# Patient Record
Sex: Female | Born: 1945 | Race: Black or African American | Hispanic: No | Marital: Single | Smoking: Never smoker
Health system: Southern US, Community
[De-identification: ages and names within clinical notes are randomized; demographics above are authoritative.]

## PROBLEM LIST (undated history)

## (undated) DIAGNOSIS — Z21 Asymptomatic human immunodeficiency virus [HIV] infection status: Secondary | ICD-10-CM

## (undated) DIAGNOSIS — B20 Human immunodeficiency virus [HIV] disease: Secondary | ICD-10-CM

## (undated) DIAGNOSIS — I10 Essential (primary) hypertension: Secondary | ICD-10-CM

## (undated) HISTORY — PX: BREAST EXCISIONAL BIOPSY: SUR124

## (undated) HISTORY — PX: OTHER SURGICAL HISTORY: SHX169

---

## 2015-09-28 DIAGNOSIS — I2510 Atherosclerotic heart disease of native coronary artery without angina pectoris: Secondary | ICD-10-CM | POA: Insufficient documentation

## 2015-09-28 DIAGNOSIS — I10 Essential (primary) hypertension: Secondary | ICD-10-CM | POA: Insufficient documentation

## 2015-09-28 DIAGNOSIS — E782 Mixed hyperlipidemia: Secondary | ICD-10-CM | POA: Insufficient documentation

## 2016-02-17 ENCOUNTER — Ambulatory Visit
Admission: RE | Admit: 2016-02-17 | Discharge: 2016-02-17 | Disposition: A | Discharge status: Home / Self Care | Payer: Medicare Other | Source: Ambulatory Visit | Attending: Ophthalmology | Admitting: Ophthalmology

## 2016-02-17 ENCOUNTER — Other Ambulatory Visit
Admission: RE | Admit: 2016-02-17 | Discharge: 2016-02-17 | Disposition: A | Discharge status: Home / Self Care | Payer: Medicare Other | Source: Ambulatory Visit | Attending: Ophthalmology | Admitting: Ophthalmology

## 2016-02-17 ENCOUNTER — Other Ambulatory Visit: Payer: Self-pay | Admitting: Ophthalmology

## 2016-02-17 DIAGNOSIS — H2000 Unspecified acute and subacute iridocyclitis: Secondary | ICD-10-CM | POA: Diagnosis present

## 2016-02-17 DIAGNOSIS — H209 Unspecified iridocyclitis: Secondary | ICD-10-CM

## 2016-02-17 LAB — CBC
HCT: 37.8 % (ref 35.0–47.0)
Hemoglobin: 13.2 g/dL (ref 12.0–16.0)
MCH: 29.2 pg (ref 26.0–34.0)
MCHC: 34.9 g/dL (ref 32.0–36.0)
MCV: 83.5 fL (ref 80.0–100.0)
Platelets: 186 10*3/uL (ref 150–440)
RBC: 4.52 MIL/uL (ref 3.80–5.20)
RDW: 13.3 % (ref 11.5–14.5)
WBC: 5.3 10*3/uL (ref 3.6–11.0)

## 2016-02-17 LAB — SEDIMENTATION RATE: Sed Rate: 48 mm/hr — ABNORMAL HIGH (ref 0–30)

## 2016-02-18 LAB — ANA W/REFLEX IF POSITIVE: Anti Nuclear Antibody(ANA): NEGATIVE

## 2016-02-19 LAB — MISC LABCORP TEST (SEND OUT): Labcorp test code: 10116

## 2016-02-20 LAB — QUANTIFERON IN TUBE
QFT TB AG MINUS NIL VALUE: 0.09 IU/mL
QUANTIFERON MITOGEN VALUE: 9.15 IU/mL
QUANTIFERON TB AG VALUE: 0.17 IU/mL
QUANTIFERON TB GOLD: NEGATIVE
Quantiferon Nil Value: 0.08 IU/mL

## 2016-02-20 LAB — QUANTIFERON TB GOLD ASSAY (BLOOD)

## 2016-02-23 LAB — MISC LABCORP TEST (SEND OUT): Labcorp test code: 6924

## 2016-04-24 DIAGNOSIS — Z9884 Bariatric surgery status: Secondary | ICD-10-CM | POA: Insufficient documentation

## 2016-04-27 ENCOUNTER — Emergency Department: Payer: Medicare Other

## 2016-04-27 ENCOUNTER — Emergency Department
Admission: EM | Admit: 2016-04-27 | Discharge: 2016-04-27 | Payer: Medicare Other | Attending: Emergency Medicine | Admitting: Emergency Medicine

## 2016-04-27 ENCOUNTER — Encounter: Payer: Self-pay | Admitting: Emergency Medicine

## 2016-04-27 DIAGNOSIS — Z79899 Other long term (current) drug therapy: Secondary | ICD-10-CM | POA: Insufficient documentation

## 2016-04-27 DIAGNOSIS — L0291 Cutaneous abscess, unspecified: Secondary | ICD-10-CM

## 2016-04-27 DIAGNOSIS — L02211 Cutaneous abscess of abdominal wall: Secondary | ICD-10-CM | POA: Insufficient documentation

## 2016-04-27 DIAGNOSIS — I10 Essential (primary) hypertension: Secondary | ICD-10-CM | POA: Diagnosis not present

## 2016-04-27 DIAGNOSIS — Z21 Asymptomatic human immunodeficiency virus [HIV] infection status: Secondary | ICD-10-CM | POA: Diagnosis not present

## 2016-04-27 DIAGNOSIS — T8140XA Infection following a procedure, unspecified, initial encounter: Secondary | ICD-10-CM | POA: Insufficient documentation

## 2016-04-27 HISTORY — DX: Asymptomatic human immunodeficiency virus (hiv) infection status: Z21

## 2016-04-27 HISTORY — DX: Human immunodeficiency virus (HIV) disease: B20

## 2016-04-27 HISTORY — DX: Essential (primary) hypertension: I10

## 2016-04-27 LAB — URINALYSIS, COMPLETE (UACMP) WITH MICROSCOPIC
Bacteria, UA: NONE SEEN
Bilirubin Urine: NEGATIVE
Glucose, UA: 50 mg/dL — AB
Hgb urine dipstick: NEGATIVE
Ketones, ur: 5 mg/dL — AB
Leukocytes, UA: NEGATIVE
Nitrite: NEGATIVE
Protein, ur: 100 mg/dL — AB
Specific Gravity, Urine: 1.026 (ref 1.005–1.030)
pH: 5 (ref 5.0–8.0)

## 2016-04-27 LAB — INFLUENZA PANEL BY PCR (TYPE A & B)
Influenza A By PCR: NEGATIVE
Influenza B By PCR: NEGATIVE

## 2016-04-27 LAB — COMPREHENSIVE METABOLIC PANEL
ALT: 18 U/L (ref 14–54)
AST: 31 U/L (ref 15–41)
Albumin: 3.5 g/dL (ref 3.5–5.0)
Alkaline Phosphatase: 80 U/L (ref 38–126)
Anion gap: 11 (ref 5–15)
BUN: 9 mg/dL (ref 6–20)
CO2: 25 mmol/L (ref 22–32)
Calcium: 9 mg/dL (ref 8.9–10.3)
Chloride: 101 mmol/L (ref 101–111)
Creatinine, Ser: 0.94 mg/dL (ref 0.44–1.00)
GFR calc Af Amer: >60 mL/min (ref >60)
GFR calc non Af Amer: >60 mL/min (ref >60)
Glucose, Bld: 117 mg/dL — ABNORMAL HIGH (ref 65–99)
Potassium: 3.5 mmol/L (ref 3.5–5.1)
Sodium: 137 mmol/L (ref 135–145)
Total Bilirubin: 1.8 mg/dL — ABNORMAL HIGH (ref 0.3–1.2)
Total Protein: 8.5 g/dL — ABNORMAL HIGH (ref 6.5–8.1)

## 2016-04-27 LAB — CBC WITH DIFFERENTIAL/PLATELET
Basophils Absolute: 0 10*3/uL (ref 0–0.1)
Basophils Relative: 0 %
Eosinophils Absolute: 0 10*3/uL (ref 0–0.7)
Eosinophils Relative: 0 %
HCT: 39.8 % (ref 35.0–47.0)
Hemoglobin: 13.7 g/dL (ref 12.0–16.0)
Lymphocytes Relative: 8 %
Lymphs Abs: 1 10*3/uL (ref 1.0–3.6)
MCH: 28.7 pg (ref 26.0–34.0)
MCHC: 34.5 g/dL (ref 32.0–36.0)
MCV: 83.3 fL (ref 80.0–100.0)
Monocytes Absolute: 0.6 10*3/uL (ref 0.2–0.9)
Monocytes Relative: 5 %
Neutro Abs: 10.9 10*3/uL — ABNORMAL HIGH (ref 1.4–6.5)
Neutrophils Relative %: 87 %
Platelets: 284 10*3/uL (ref 150–440)
RBC: 4.78 MIL/uL (ref 3.80–5.20)
RDW: 13.6 % (ref 11.5–14.5)
WBC: 12.5 10*3/uL — ABNORMAL HIGH (ref 3.6–11.0)

## 2016-04-27 LAB — LACTIC ACID, PLASMA: Lactic Acid, Venous: 1.9 mmol/L (ref 0.5–1.9)

## 2016-04-27 LAB — PROTIME-INR
INR: 1.2
Prothrombin Time: 15.3 seconds — ABNORMAL HIGH (ref 11.4–15.2)

## 2016-04-27 MED ORDER — ACETAMINOPHEN 325 MG PO TABS
650.0000 mg | ORAL_TABLET | Freq: Once | ORAL | Status: AC
  Administered 2016-04-27: 650 mg via ORAL
  Filled 2016-04-27: qty 2

## 2016-04-27 MED ORDER — VANCOMYCIN HCL IN DEXTROSE 1-5 GM/200ML-% IV SOLN
1000.0000 mg | Freq: Once | INTRAVENOUS | Status: AC
  Administered 2016-04-27: 1000 mg via INTRAVENOUS
  Filled 2016-04-27: qty 200

## 2016-04-27 MED ORDER — IOPAMIDOL (ISOVUE-300) INJECTION 61%
15.0000 mL | INTRAVENOUS | Status: AC
  Administered 2016-04-27 (×2): 15 mL via ORAL

## 2016-04-27 MED ORDER — PIPERACILLIN-TAZOBACTAM 3.375 G IVPB 30 MIN
3.3750 g | Freq: Once | INTRAVENOUS | Status: AC
  Administered 2016-04-27: 3.375 g via INTRAVENOUS
  Filled 2016-04-27: qty 50

## 2016-04-27 MED ORDER — IOPAMIDOL (ISOVUE-300) INJECTION 61%
100.0000 mL | Freq: Once | INTRAVENOUS | Status: AC | PRN
  Administered 2016-04-27: 100 mL via INTRAVENOUS

## 2016-04-27 NOTE — ED Notes (Signed)
Code sepsis per dr Mayford Knifewilliams

## 2016-04-27 NOTE — ED Provider Notes (Signed)
Time Seen: Approximately 1500  I have reviewed the triage notes  Chief Complaint: Abscess   History of Present Illness: Becky Lee is a 71 y.o. female who has a history of being HIV positive since 1998. The patient does take her medication and has recently developed a local connections here in the [place] area for infectious disease. She states her last CD4 count was greater than 800. She states that she developed some discomfort over the area of her lap band surgery now for the past 1-2 months. She states she noticed some swelling and discomfort especially when she would sit upright. She started noticing some slight drainage from an area over the left upper abdominal region and was started on some Keflex by her primary physician. She came here because the discharge and drainage seemed to be worse. She was not aware that she had a fever and presents in triage area temperature 102.6. Any flu symptoms, shortness of breath, or urinary symptoms, etc. She denies any significant headache photophobia or neck stiffness. Drainage in her abdominal area from the swelling is over an area where she had lap band surgery. She states she has not had any adjustments of her band for several years.   Past Medical History:  Diagnosis Date  . HIV (human immunodeficiency virus infection) (HCC)   . Hypertension     There are no active problems to display for this patient.   Past Surgical History:  Procedure Laterality Date  . lap band surgery      Past Surgical History:  Procedure Laterality Date  . lap band surgery        Allergies:  Patient has no known allergies.  Family History: History reviewed. No pertinent family history.  Social History: Social History  Substance Use Topics  . Smoking status: Never Smoker  . Smokeless tobacco: Never Used  . Alcohol use Yes     Review of Systems:   10 point review of systems was performed and was otherwise negative:  Constitutional: No  feverPrior to arrival that she was aware of. Eyes: No visual disturbances ENT: No sore throat, ear pain Cardiac: No chest pain Respiratory: No shortness of breath, wheezing, or stridor AbdomenMild abdominal discomfort over the area of swelling with no vomiting or diarrhea Endocrine: No weight loss, No night sweats Extremities: No peripheral edema, cyanosis Skin: No rashes, easy bruising Neurologic: No focal weakness, trouble with speech or swollowing Urologic: No dysuria, Hematuria, or urinary frequency She denies any rashes or recent travel  Physical Exam:  ED Triage Vitals  Enc Vitals Group     BP 04/27/16 1419 113/76     Pulse Rate 04/27/16 1419 (!) 113     Resp 04/27/16 1419 16     Temp 04/27/16 1419 (!) 102.6 F (39.2 C)     Temp Source 04/27/16 1419 Oral     SpO2 04/27/16 1419 100 %     Weight 04/27/16 1420 260 lb (117.9 kg)     Height 04/27/16 1420 5\' 5"  (1.651 m)     Head Circumference --      Peak Flow --      Pain Score --      Pain Loc --      Pain Edu? --      Excl. in GC? --     General: Awake , Alert , and Oriented times 3; GCS 15 Head: Normal cephalic , atraumatic Eyes: Pupils equal , round, reactive to light Nose/Throat: No nasal drainage, patent  upper airway Mild erythema without exudate or drainage across the soft palate  Neck: Supple, Full range of motion, No anterior adenopathy or palpable thyroid masses Lungs: Clear to ascultation without wheezes , rhonchi, or rales Heart: Regular rate, regular rhythm without murmurs , gallops , or rubs Abdomen: Soft, non tender without rebound, guarding , or rigidity; bowel sounds positive and symmetric in all 4 quadrants. Area of serosanguineous drainage with some induration of the left upper abdominal area with no palpable abscess no palpable tenderness over the right lower quadrant, negative Murphy's sign        Extremities: 2 plus symmetric pulses. No edema, clubbing or cyanosis Neurologic: normal ambulation, Motor  symmetric without deficits, sensory intact Skin: warm, dry, no rashes   Labs:   All laboratory work was reviewed including any pertinent negatives or positives listed below:  Labs Reviewed  CBC WITH DIFFERENTIAL/PLATELET - Abnormal; Notable for the following:       Result Value   WBC 12.5 (*)    Neutro Abs 10.9 (*)    All other components within normal limits  PROTIME-INR - Abnormal; Notable for the following:    Prothrombin Time 15.3 (*)    All other components within normal limits  CULTURE, BLOOD (ROUTINE X 2)  CULTURE, BLOOD (ROUTINE X 2)  URINE CULTURE  RAPID INFLUENZA A&B ANTIGENS ([facility] ONLY)  COMPREHENSIVE METABOLIC PANEL  LACTIC ACID, PLASMA  LACTIC ACID, PLASMA  URINALYSIS, COMPLETE (UACMP) WITH MICROSCOPIC   Radiology:  "Dg Chest 2 View  Result Date: 04/27/2016 CLINICAL DATA:  LEFT abdominal swelling and drainage. Fever. History of HIV and hypertension. EXAM: CHEST  2 VIEW COMPARISON:  Chest radiograph February 17, 2016 FINDINGS: Cardiomediastinal silhouette is normal. No pleural effusions or focal consolidations. Strandy densities RIGHT lung base. Trachea projects midline and there is no pneumothorax. Soft tissue planes and included osseous structures are non-suspicious. Surgical clips at GE junction. Moderate degenerative change RIGHT shoulder. Mild degenerative change of thoracic spine. Catheter projects in anterior abdomen. IMPRESSION: RIGHT lung base atelectasis. Electronically Signed   By: Awilda Metroourtnay  Bloomer M.D.   On: 04/27/2016 16:24   Ct Abdomen Pelvis W Contrast  Result Date: 04/27/2016 CLINICAL DATA:  Lap band surgery 2008 complains of swelling to the left abdomen for several months purulent drainage to the left abdomen EXAM: CT ABDOMEN AND PELVIS WITH CONTRAST TECHNIQUE: Multidetector CT imaging of the abdomen and pelvis was performed using the standard protocol following bolus administration of intravenous contrast. CONTRAST:  100mL ISOVUE-300 IOPAMIDOL  (ISOVUE-300) INJECTION 61% COMPARISON:  None. FINDINGS: Lower chest: Lung bases demonstrate no acute infiltrate or effusion. Borderline cardiomegaly. No pericardial effusion. Small to moderate hiatal hernia. Hepatobiliary: No focal hepatic abnormality is seen. There are multiple calcified stones within the gallbladder. No gallbladder wall thickening. No biliary dilatation. Pancreas: Unremarkable. No pancreatic ductal dilatation or surrounding inflammatory changes. Spleen: Normal in size without focal abnormality. Adrenals/Urinary Tract: Adrenal glands are within normal limits. No hydronephrosis. 1 cm cyst mid right kidney. 2.3 cm cyst mid left kidney. Bladder normal Stomach/Bowel: The patient is status post gastric banding. There is a small moderate hiatal hernia superior to the gastric band. There is no dilated small bowel. There is no colon wall thickening. Vascular/Lymphatic: Aortic atherosclerosis. No enlarged abdominal or pelvic lymph nodes. Reproductive: Patient is status post hysterectomy. No left adnexal mass. 3.8 cm low-attenuation lesion in the right adnexa. Other: No free air. Trace free fluid. There is skin thickening over the left abdominal wall. There is moderate fluid  and inflammatory change surrounding the port reservoir in the left abdominal wall. The tubing appears grossly intact. There is fluid density extending along the proximal tubing through the rectus sheath and within the left anterior peritoneal cavity. Musculoskeletal: There are degenerative changes of the spine. No acute or suspicious bone lesions. IMPRESSION: 1. Left abdominal wall skin thickening with moderate fluid and soft tissue inflammation surrounding the port reservoir in the left anterior abdominal wall. There is fluid surrounding the portion of tubing connected to the port reservoir, fluid extends along the tubing through the rectus sheath and into the anterior peritoneal cavity where there is a small amount of inflammatory  response also present. The findings would be concerning for port reservoir infection. Late port reservoir infections can be seen in association with intragastric band erosion and EGD is suggested for further evaluation. 2. 3.8 cm low-attenuation mass in the right adnexa. Pelvic ultrasound recommended for further evaluation. 3. Gallstones Electronically Signed   By: Jasmine Pang M.D.   On: 04/27/2016 18:41  "  I personally reviewed the radiologic studies    ED Course: * The patient's stay here was uneventful and she was started on IV antibiotics. She does not appear to be septic and her source of infection at this time though seems to be an infection tracking along her lap band port. The patient was started on vancomycin and Zosyn IV. Arrangements were discussed with Dr. Smitty Cords who was on call for bariatric surgery. [facility] assessment and likely removal of her lap band     Assessment: * Infected lap band port     Plan: * Transfer to [facility]            Jennye Moccasin, MD 04/27/16 2029

## 2016-04-27 NOTE — ED Triage Notes (Signed)
Pt c/o swelling to left abdomen for a couple months. Went to doctor Monday and was put on keflex. Redness/swelling/purulent drainage to left abdomen. Appears to be induration as well.  Pt has fever in triage.

## 2016-04-27 NOTE — Progress Notes (Signed)
Pharmacy Antibiotic Note  Becky Lee is a 71 y.o. female admitted on 04/27/2016 with wound infection/abscess.  Pharmacy has been consulted for vancomycin dosing.  Plan: 1. Vancomycin 1 gm IV x 1 in ED followed in 6 hours (stacked dosing) by vancomycin 1 gm IV Q12H, predicted trough 15 mcg/mL. Pharmacy will continue to follow and adjust as needed to maintain trough 15 to 20 mcg/mL.   Vd 56.7 L, Ke 0.065 hr-1, T1/2 10.6 hr  Height: 5\' 5"  (165.1 cm) Weight: 260 lb (117.9 kg) IBW/kg (Calculated) : 57  Temp (24hrs), Avg:101 F (38.3 C), Min:99.4 F (37.4 C), Max:102.6 F (39.2 C)   Recent Labs Lab 04/27/16 1523  WBC 12.5*  CREATININE 0.94  LATICACIDVEN 1.9    Estimated Creatinine Clearance: 73.6 mL/min (by C-G formula based on SCr of 0.94 mg/dL).    No Known Allergies  Thank you for allowing pharmacy to be a part of this patient's care.  Carola FrostNathan A Kinston Magnan, Pharm.D., BCPS Clinical Pharmacist 04/27/2016 9:56 PM

## 2016-04-27 NOTE — ED Notes (Signed)
[facility] who is on the way to pick up patient and is 50 minutes out.

## 2016-04-27 NOTE — ED Notes (Signed)
Report attempted with Jacki ConesLaurie, CN at [facility] [address].  She asked for me to call back in 20 minutes.  They are arranging transportation and have not left their facility yet, so I agreed.  I was told from my initial contact that put me in touch with Jacki ConesLaurie that this patient's nurse had not arrived to work yet due to inclement weather.  I will call back in 20 minutes.

## 2016-04-28 DIAGNOSIS — T847XXA Infection and inflammatory reaction due to other internal orthopedic prosthetic devices, implants and grafts, initial encounter: Secondary | ICD-10-CM | POA: Insufficient documentation

## 2016-04-28 DIAGNOSIS — B20 Human immunodeficiency virus [HIV] disease: Secondary | ICD-10-CM | POA: Insufficient documentation

## 2016-04-28 DIAGNOSIS — K754 Autoimmune hepatitis: Secondary | ICD-10-CM | POA: Insufficient documentation

## 2016-04-28 DIAGNOSIS — Z21 Asymptomatic human immunodeficiency virus [HIV] infection status: Secondary | ICD-10-CM | POA: Insufficient documentation

## 2016-04-29 LAB — URINE CULTURE

## 2016-05-01 DIAGNOSIS — B9689 Other specified bacterial agents as the cause of diseases classified elsewhere: Secondary | ICD-10-CM | POA: Insufficient documentation

## 2016-05-02 LAB — CULTURE, BLOOD (ROUTINE X 2)
Culture: NO GROWTH
Culture: NO GROWTH

## 2016-05-23 DIAGNOSIS — I10 Essential (primary) hypertension: Secondary | ICD-10-CM | POA: Insufficient documentation

## 2017-11-12 IMAGING — CT CT ABD-PELV W/ CM
2 of 5 series · 15 of 46 positions shown, 17 images · IV contrast (APPLIED)
Comparison: None.

CLINICAL DATA: Lap band surgery 0331 complains of swelling to the
left abdomen for several months purulent drainage to the left
abdomen

EXAM:
CT ABDOMEN AND PELVIS WITH CONTRAST
TECHNIQUE: Multidetector CT imaging of the abdomen and pelvis was performed
using the standard protocol following bolus administration of
intravenous contrast.
CONTRAST:  100mL 50FZUW-SII IOPAMIDOL (50FZUW-SII) INJECTION 61%

[Series 2: routine abd/pel with · axial · 0.72mm/px · z∈[-583,-178]mm · 12 of 93 slices shown, 14 images]
[im 6/93  soft-tissue]
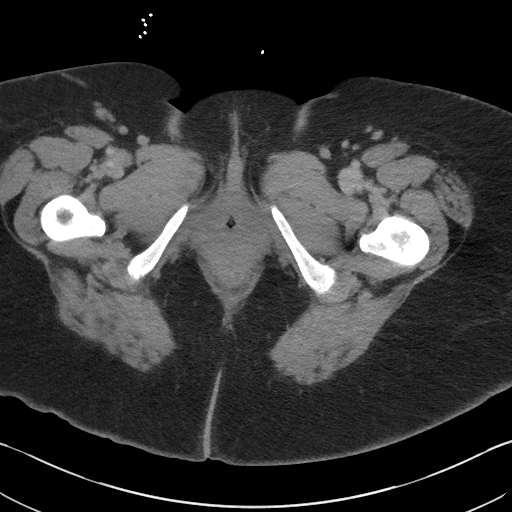
[im 6/93  bone]
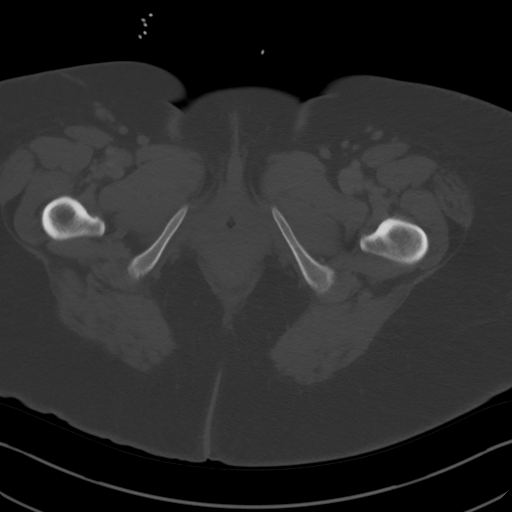
[im 17/93  soft-tissue]
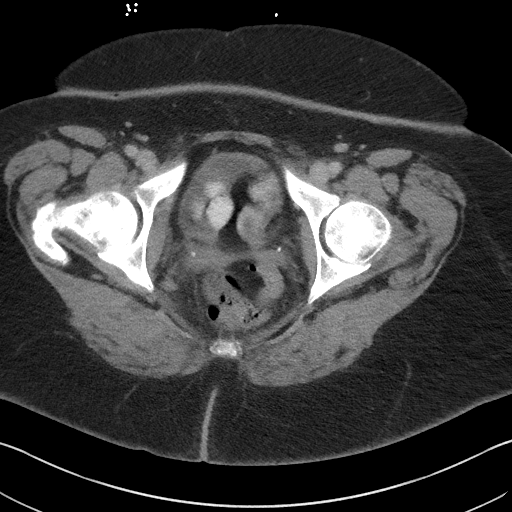
[im 22/93  soft-tissue]
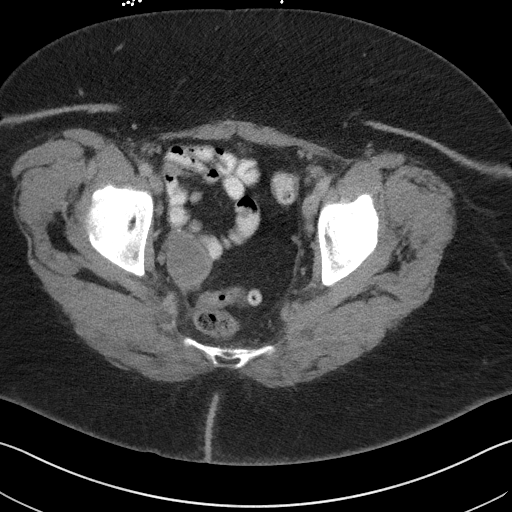
[im 28/93  soft-tissue]
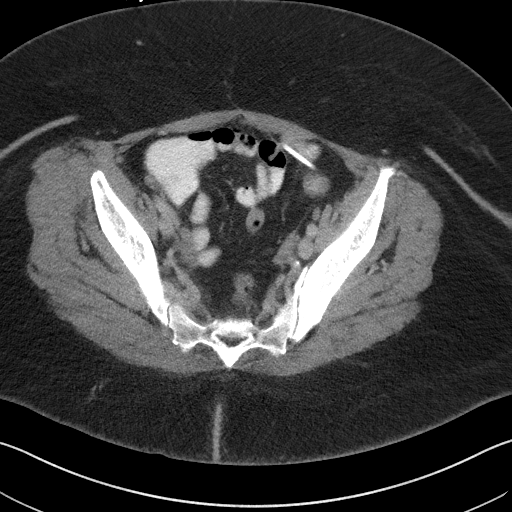
[im 38/93  soft-tissue]
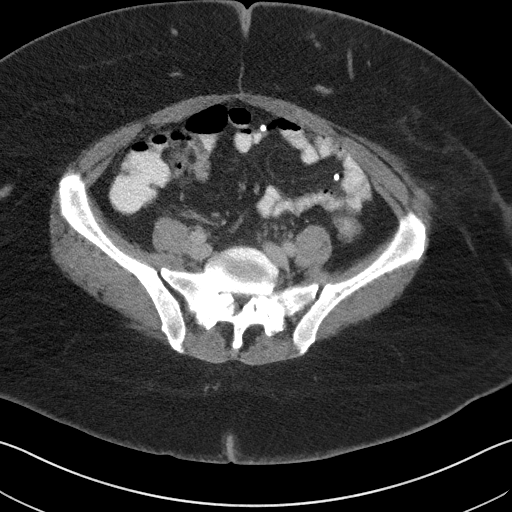
[im 44/93  soft-tissue]
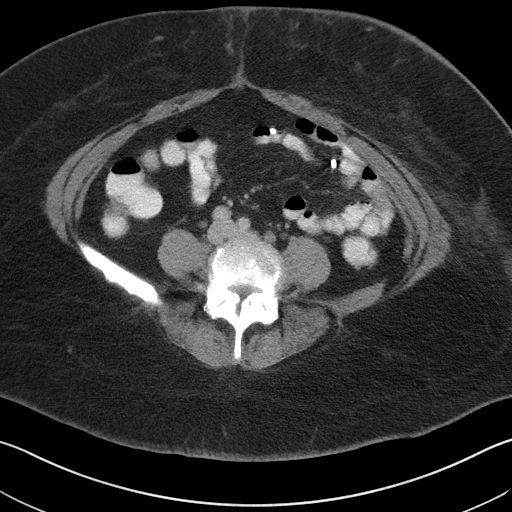
[im 49/93  soft-tissue]
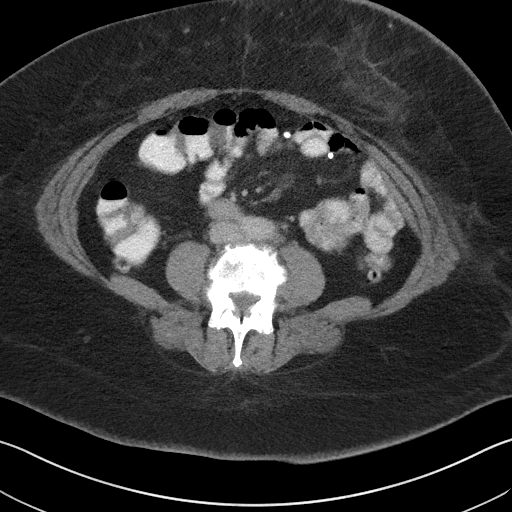
[im 60/93  soft-tissue]
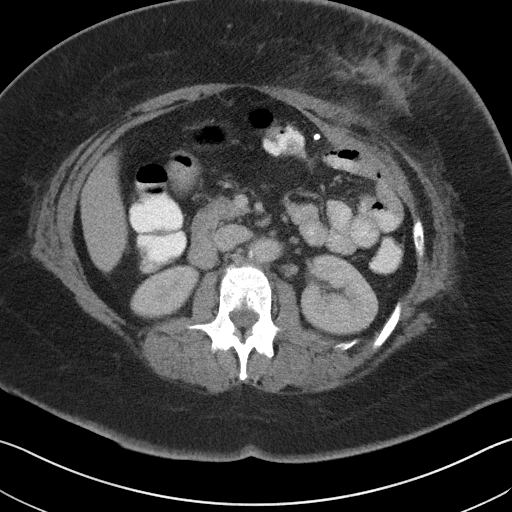
[im 65/93  soft-tissue]
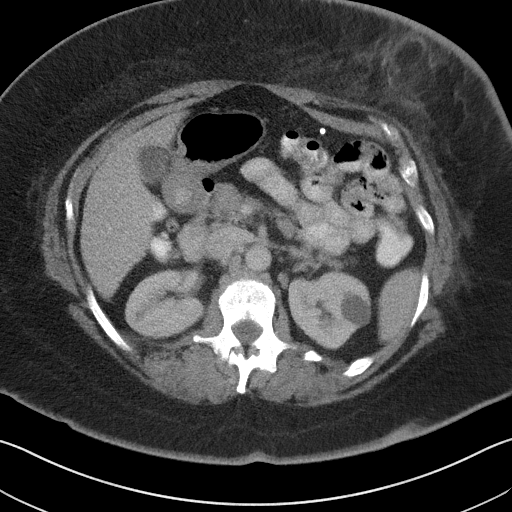
[im 65/93  bone]
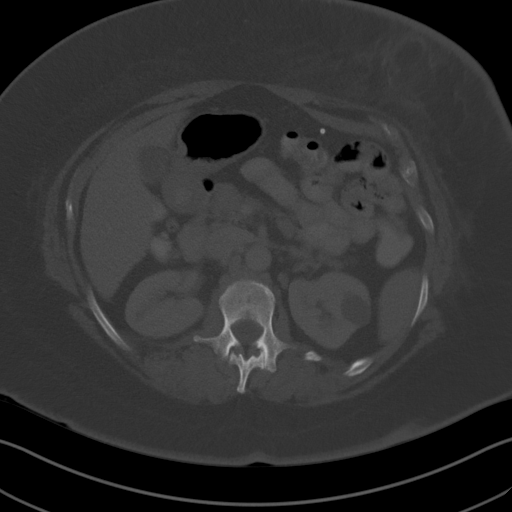
[im 71/93  soft-tissue]
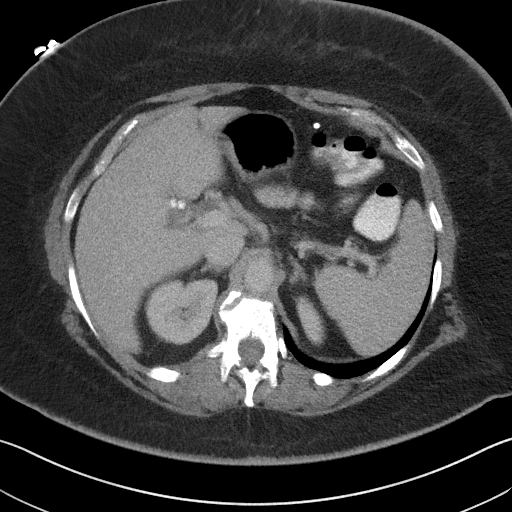
[im 82/93  soft-tissue]
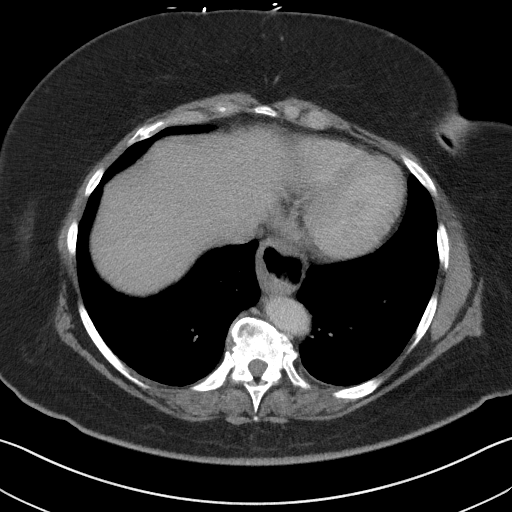
[im 87/93  soft-tissue]
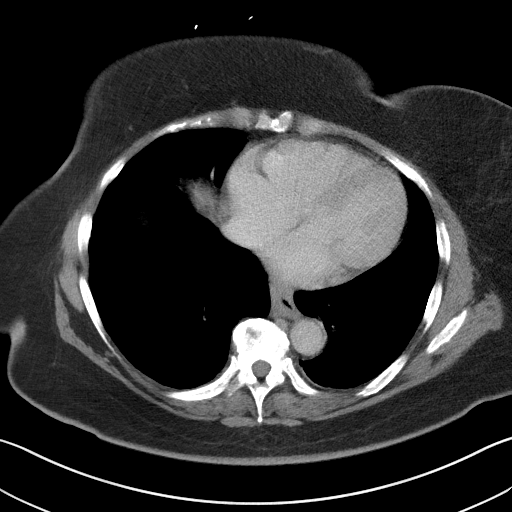

[Series 5: coronal st · coronal · 0.75mm/px · 3 of 101 slices shown]
[im 34/101  soft-tissue]
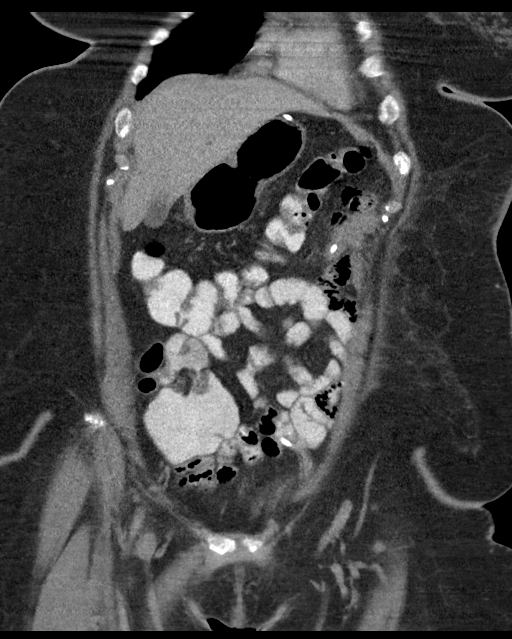
[im 45/101  soft-tissue]
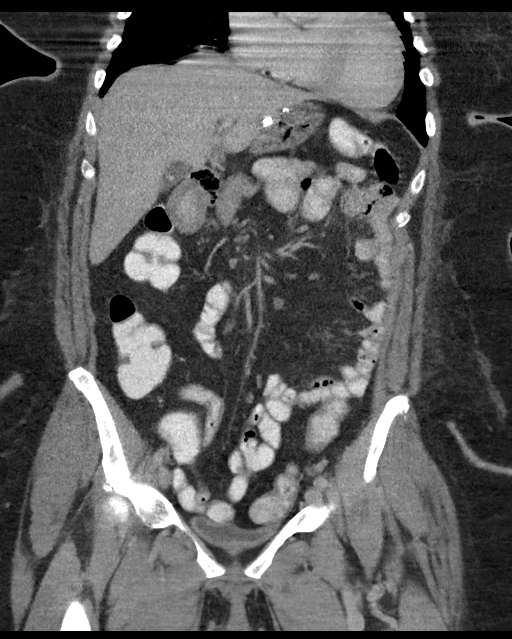
[im 56/101  soft-tissue]
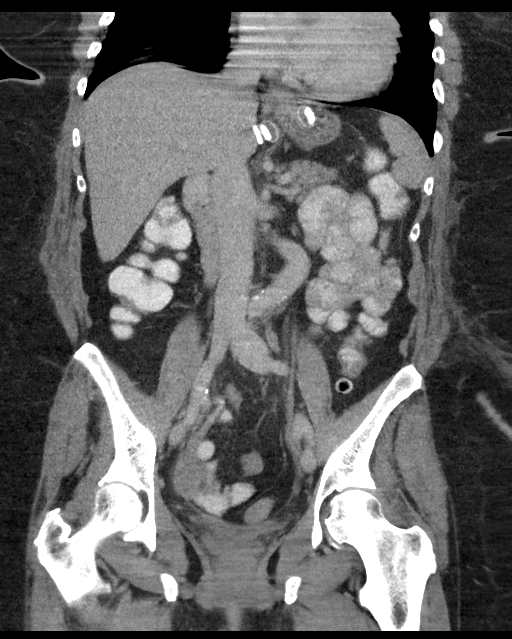

[15 of 46 positions shown; findings below may reference images not displayed]

FINDINGS: Lower chest: Lung bases demonstrate no acute infiltrate or effusion.
Borderline cardiomegaly. No pericardial effusion. Small to moderate
hiatal hernia.

Hepatobiliary: No focal hepatic abnormality is seen. There are
multiple calcified stones within the gallbladder. No gallbladder
wall thickening. No biliary dilatation.

Pancreas: Unremarkable. No pancreatic ductal dilatation or
surrounding inflammatory changes.

Spleen: Normal in size without focal abnormality.

Adrenals/Urinary Tract: Adrenal glands are within normal limits. No
hydronephrosis. 1 cm cyst mid right kidney. 2.3 cm cyst mid left
kidney. Bladder normal

Stomach/Bowel: The patient is status post gastric banding. There is
a small moderate hiatal hernia superior to the gastric band. There
is no dilated small bowel. There is no colon wall thickening.

Vascular/Lymphatic: Aortic atherosclerosis. No enlarged abdominal or
pelvic lymph nodes.

Reproductive: Patient is status post hysterectomy. No left adnexal
mass. 3.8 cm low-attenuation lesion in the right adnexa.

Other: No free air. Trace free fluid. There is skin thickening over
the left abdominal wall. There is moderate fluid and inflammatory
change surrounding the port reservoir in the left abdominal wall.
The tubing appears grossly intact. There is fluid density extending
along the proximal tubing through the rectus sheath and within the
left anterior peritoneal cavity.

Musculoskeletal: There are degenerative changes of the spine. No
acute or suspicious bone lesions.
IMPRESSION: 1. Left abdominal wall skin thickening with moderate fluid and soft
tissue inflammation surrounding the port reservoir in the left
anterior abdominal wall. There is fluid surrounding the portion of
tubing connected to the port reservoir, fluid extends along the
tubing through the rectus sheath and into the anterior peritoneal
cavity where there is a small amount of inflammatory response also
present. The findings would be concerning for port reservoir
infection. Late port reservoir infections can be seen in association
with intragastric band erosion and EGD is suggested for further
evaluation.
2. 3.8 cm low-attenuation mass in the right adnexa. Pelvic
ultrasound recommended for further evaluation.
3. Gallstones

## 2017-12-19 DIAGNOSIS — E559 Vitamin D deficiency, unspecified: Secondary | ICD-10-CM | POA: Insufficient documentation

## 2018-01-09 DIAGNOSIS — K8020 Calculus of gallbladder without cholecystitis without obstruction: Secondary | ICD-10-CM | POA: Insufficient documentation

## 2018-03-29 DIAGNOSIS — R001 Bradycardia, unspecified: Secondary | ICD-10-CM | POA: Insufficient documentation

## 2018-04-24 DIAGNOSIS — K449 Diaphragmatic hernia without obstruction or gangrene: Secondary | ICD-10-CM | POA: Insufficient documentation

## 2018-05-14 ENCOUNTER — Ambulatory Visit: Payer: Medicare Other | Attending: Infectious Diseases | Admitting: Infectious Diseases

## 2018-05-14 ENCOUNTER — Encounter: Payer: Self-pay | Admitting: Infectious Diseases

## 2018-05-14 ENCOUNTER — Other Ambulatory Visit
Admission: RE | Admit: 2018-05-14 | Discharge: 2018-05-14 | Disposition: A | Discharge status: Home / Self Care | Payer: Medicare Other | Source: Ambulatory Visit | Attending: Infectious Diseases | Admitting: Infectious Diseases

## 2018-05-14 VITALS — BP 129/84 | HR 71 | Temp 97.9°F | Wt 281.1 lb

## 2018-05-14 DIAGNOSIS — Z90710 Acquired absence of both cervix and uterus: Secondary | ICD-10-CM

## 2018-05-14 DIAGNOSIS — Z79899 Other long term (current) drug therapy: Secondary | ICD-10-CM

## 2018-05-14 DIAGNOSIS — B20 Human immunodeficiency virus [HIV] disease: Secondary | ICD-10-CM | POA: Diagnosis present

## 2018-05-14 DIAGNOSIS — Z9889 Other specified postprocedural states: Secondary | ICD-10-CM

## 2018-05-14 DIAGNOSIS — I10 Essential (primary) hypertension: Secondary | ICD-10-CM | POA: Diagnosis not present

## 2018-05-14 DIAGNOSIS — Z8619 Personal history of other infectious and parasitic diseases: Secondary | ICD-10-CM

## 2018-05-14 DIAGNOSIS — Z21 Asymptomatic human immunodeficiency virus [HIV] infection status: Secondary | ICD-10-CM

## 2018-05-14 DIAGNOSIS — Z6841 Body Mass Index (BMI) 40.0 and over, adult: Secondary | ICD-10-CM

## 2018-05-14 LAB — COMPREHENSIVE METABOLIC PANEL
ALT: 20 U/L (ref 0–44)
AST: 28 U/L (ref 15–41)
Albumin: 3.8 g/dL (ref 3.5–5.0)
Alkaline Phosphatase: 68 U/L (ref 38–126)
Anion gap: 4 — ABNORMAL LOW (ref 5–15)
BUN: 19 mg/dL (ref 8–23)
CO2: 31 mmol/L (ref 22–32)
Calcium: 9.3 mg/dL (ref 8.9–10.3)
Chloride: 105 mmol/L (ref 98–111)
Creatinine, Ser: 0.79 mg/dL (ref 0.44–1.00)
GFR calc Af Amer: >60 mL/min (ref >60)
GFR calc non Af Amer: >60 mL/min (ref >60)
Glucose, Bld: 104 mg/dL — ABNORMAL HIGH (ref 70–99)
Potassium: 3.9 mmol/L (ref 3.5–5.1)
Sodium: 140 mmol/L (ref 135–145)
Total Bilirubin: 1.2 mg/dL (ref 0.3–1.2)
Total Protein: 8.2 g/dL — ABNORMAL HIGH (ref 6.5–8.1)

## 2018-05-14 MED ORDER — EMTRICITABINE-TENOFOVIR AF 200-25 MG PO TABS
1.0000 | ORAL_TABLET | Freq: Every day | ORAL | 3 refills | Status: DC
Start: 2018-05-14 — End: 2018-08-06

## 2018-05-14 MED ORDER — DOLUTEGRAVIR SODIUM 50 MG PO TABS: 50.0000 mg | ORAL_TABLET | Freq: Every day | ORAL | 3 refills | Status: DC

## 2018-05-14 NOTE — Patient Instructions (Signed)
You are here to engage in HIV care You are going to have gastric bypass surgery and for that you need PPI- as you are on odefsey I can change to Descovy and Tivicay so as to avoid any interactions Today you will have labs- You can get the following vaccines TdaP Shingrix

## 2018-05-14 NOTE — Progress Notes (Signed)
NAME: Becky Lee  DOB: 05/31/1945  MRN: [id number]  Date/Time: 05/14/2018 9:58 AM Subjective:  REASON FOR CONSULT: here to engage in care ? Becky Lee is a 73 y.o. with a history of HIV, HTN, Morbid obesity, had lap band surgery, port infection, removal of band in 2018, autoimmune hepatitis Is here to engage in HIV care.  Patient is to be followed by Dr. Sampson Goon who is no longer in the country.  She was diagnosed in 1984 and she states it was secondary to a blood transfusion she had poor total abdominal hysterectomy.  She remembers that her nadir CD4 was in single digits.  She was in [place] then.   She is currently on Odefsey fixed drug combination of rilpivirine plus FTC plusTAF Her cd4 1118 and Vl < 20 on Sep 07, 2017.Marland Kitchen  She is 100% adherent to her medication. She is undergoing laparoscopic gastric bypass surgery third week of February.  At [facility].  She was prescribed omeprazole, Eliquis and Zofran for the surgery but because of interaction with rilpivirine it cannot be used.  She can have the option of changing her antiretroviral medication to dolutegravir plus FTC plus TAF which she is in favor off.  She has no side effects from her current medication. 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 Not sexually active in many years now  AIDS diagnosed 1984 Nadir Cd4 <50 OI - zoster,  HAARt history- many regimens Combivir, crixivan, atripla Acquired thru- blood transfusion for TAH in 1884 Genotype unknown ? Past Medical History:  Diagnosis Date  . HIV (human immunodeficiency virus infection) (HCC)   . Hypertension    morbid obesity Autoimmune hepatitis  In 2008- took steroids and azothiprine Fibroid uterus with mennorhagia   Past Surgical History:  Procedure Laterality Date  . 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  FH: alcoholism- deceased-brother Liver /lung cancer dad CVA-mother  Allergy-NKDA ? Meds Metoprolol Odefsey ramipril    REVIEW OF SYSTEMS:  Const: negative fever, negative chills, positive weight gain Eyes: negative diplopia or visual changes, negative eye pain ENT: negative coryza, negative sore throat Resp: negative cough, hemoptysis, dyspnea Cards: negative for chest pain, palpitations, lower extremity edema GU: negative for frequency, dysuria and hematuria Skin: negative for rash and pruritus Heme: negative for easy bruising and gum/nose bleeding MS: negative for myalgias, arthralgias, back pain and muscle weakness Neurolo:negative for headaches, dizziness, vertigo, memory problems  Psych: negative for feelings of anxiety, depression   Objective:  VITALS:  BP 129/84 (BP Location: Right Arm, Patient Position: Sitting, Cuff Size: Large)   Pulse 71   Temp 97.9 F (36.6 C) (Oral)   Wt 281 lb 2 oz (127.5 kg)   BMI 46.78 kg/m  PHYSICAL EXAM:  General: Alert, cooperative, no distress, appears stated age.  Head: Normocephalic, without obvious abnormality, atraumatic. Eyes: Conjunctivae clear, anicteric sclerae. Pupils are equal Nose: Nares normal. No drainage or sinus tenderness. Throat: Lips, mucosa, and tongue normal. No Thrush Neck: Supple, symmetrical, no adenopathy, thyroid: non tender no carotid bruit and no JVD. Back: No CVA tenderness. Lungs: Clear to auscultation bilaterally. No Wheezing or Rhonchi. No rales. Heart:  Regular rate and rhythm, no murmur, rub or gallop. Abdomen: Soft, non-tender,not distended. Bowel sounds normal. No masses Extremities: Extremities normal, atraumatic, no cyanosis. No edema. No clubbing Skin: No rashes or lesions. Not Jaundiced Lymph: Cervical,  supraclavicular normal. Neurologic: Grossly non-focal Pertinent Labs Hb 12.8, wbc 5.0, plt 252 IMAGING RESULTS: Health maintenance Vaccination  pneumovac- 23-9/26/19 Prevnar-13-11/30/18 HepB unknown HepA unknown TdaP not known Flu this season Herpes zoster-  ______________________ labs RPR HEPC ab Lipid- 12/27/17- TC 167,TGL 81, HDL 48.3, LDL 103, VLDL 16 CMv TOXO -IGG/IgM HIV VL Cd4 quantiferon Gold GC/CHL [id number] Genotype HIV antibody TSH 3.527 ( 12/27/17)  Preventive  Dental Colonoscopy Opthal cervical pap-not sexually active and has not had it in many years Mammogram-not done  Impression/Recommendation ?73 year old female with history of HIV AIDS diagnosed in the 60s and now on Odefsey and 100% adherent to the medication.   ? ?HIV AIDS: Last viral load is less than 20 and CD4 is more than thousand on extra combination of rilpivirine plus FTC plus TAF.  Because of the need for proton pump inhibitor following gastric bypass surgery I am going to switch the current regimen to dolutegravir plus FTC plus TAF in order to avoid interaction with GI.  PPI rilpivirine combination reduces the concentration of rilpivirine and its efficacy is reduced as well.  Discussed the side effects of dolutegravir. We will get new labs today.  Labs will include CD4, viral load, genotype, RPR, QuantiFERON gold, CMP hepatitis panel.  Hypertension on metoprolol and ramipril History of coronary artery disease diagnosed when she had a work-up for the first lap band surgery  History of autoimmune hepatitis was on prednisone and azathioprine when she was in [place] but not anymore.  There is a possibility that it was drug-induced hepatitis.   On September 2019 LFTs were normal.  AST was 22, ALT 15, alkaline phosphatase 64 and bilirubin was 1.  Obesity: Had lap band surgery in 2008 and it was removed in 2018 because of the port infection.  She is going to have her gastric bypass third week  of February.  Health maintenance need to be updated. Vaccination status has to be updated as well. No partner notification needed currently as she is not sexually active the management with the patient.  ___________________________________________________ Discussed with patient, requesting provider.Follow-up in 3 months

## 2018-05-15 LAB — HEPATITIS PANEL, ACUTE
HCV Ab: <0.1 s/co ratio (ref 0.0–0.9)
Hep A IgM: NEGATIVE
Hep B C IgM: NEGATIVE
Hepatitis B Surface Ag: NEGATIVE

## 2018-05-15 LAB — HEPATITIS B SURFACE ANTIBODY, QUANTITATIVE: Hepatitis B-Post: 172.4 m[IU]/mL (ref >9.9)

## 2018-05-15 LAB — HIV-1 RNA QUANT-NO REFLEX-BLD
HIV 1 RNA Quant: <20 copies/mL
LOG10 HIV-1 RNA: UNDETERMINED log10copy/mL

## 2018-05-15 LAB — RPR: RPR Ser Ql: NONREACTIVE

## 2018-05-16 LAB — T-HELPER CELLS CD4/CD8 %
% CD 4 Pos. Lymph.: 38.6 % (ref 30.8–58.5)
Absolute CD 4 Helper: 540 /uL (ref 359–1519)
Basophils Absolute: 0 10*3/uL (ref 0.0–0.2)
Basos: 0 %
CD3+CD4+ Cells/CD3+CD8+ Cells Bld: 1.73 (ref 0.92–3.72)
CD3+CD8+ Cells # Bld: 312 /uL (ref 109–897)
CD3+CD8+ Cells NFr Bld: 22.3 % (ref 12.0–35.5)
EOS (ABSOLUTE): 0 10*3/uL (ref 0.0–0.4)
Eos: 1 %
Hematocrit: 37.7 % (ref 34.0–46.6)
Hemoglobin: 12.8 g/dL (ref 11.1–15.9)
Immature Grans (Abs): 0 10*3/uL (ref 0.0–0.1)
Immature Granulocytes: 0 %
Lymphocytes Absolute: 1.4 10*3/uL (ref 0.7–3.1)
Lymphs: 26 %
MCH: 29.6 pg (ref 26.6–33.0)
MCHC: 34 g/dL (ref 31.5–35.7)
MCV: 87 fL (ref 79–97)
Monocytes Absolute: 0.3 10*3/uL (ref 0.1–0.9)
Monocytes: 6 %
Neutrophils Absolute: 3.6 10*3/uL (ref 1.4–7.0)
Neutrophils: 67 %
Platelets: 210 10*3/uL (ref 150–450)
RBC: 4.32 x10E6/uL (ref 3.77–5.28)
RDW: 14.1 % (ref 11.7–15.4)
WBC: 5.4 10*3/uL (ref 3.4–10.8)

## 2018-05-16 LAB — QUANTIFERON-TB GOLD PLUS (RQFGPL)
QuantiFERON Mitogen Value: 8.17 IU/mL
QuantiFERON Nil Value: 0.03 IU/mL
QuantiFERON TB1 Ag Value: 0.03 IU/mL
QuantiFERON TB2 Ag Value: 0.03 IU/mL

## 2018-05-16 LAB — QUANTIFERON-TB GOLD PLUS: QuantiFERON-TB Gold Plus: NEGATIVE

## 2018-05-21 LAB — HLA B*5701: HLA B 5701: NEGATIVE

## 2018-05-21 LAB — MISC LABCORP TEST (SEND OUT): Labcorp test code: 6926

## 2018-06-01 LAB — MISC LABCORP TEST (SEND OUT): Labcorp test code: 551776

## 2018-08-06 ENCOUNTER — Other Ambulatory Visit: Payer: Self-pay | Admitting: Licensed Clinical Social Worker

## 2018-08-06 DIAGNOSIS — B20 Human immunodeficiency virus [HIV] disease: Secondary | ICD-10-CM

## 2018-08-06 MED ORDER — EMTRICITABINE-TENOFOVIR AF 200-25 MG PO TABS: 1.0000 | ORAL_TABLET | Freq: Every day | ORAL | 3 refills | Status: DC

## 2018-08-06 MED ORDER — DOLUTEGRAVIR SODIUM 50 MG PO TABS: 50.0000 mg | ORAL_TABLET | Freq: Every day | ORAL | 3 refills | Status: DC

## 2018-10-28 ENCOUNTER — Other Ambulatory Visit: Payer: Self-pay | Admitting: Licensed Clinical Social Worker

## 2018-10-28 DIAGNOSIS — B20 Human immunodeficiency virus [HIV] disease: Secondary | ICD-10-CM

## 2018-10-28 MED ORDER — DOLUTEGRAVIR SODIUM 50 MG PO TABS: 50.0000 mg | ORAL_TABLET | Freq: Every day | ORAL | 3 refills | Status: DC

## 2018-10-28 MED ORDER — DESCOVY 200-25 MG PO TABS
1.0000 | ORAL_TABLET | Freq: Every day | ORAL | 3 refills | Status: DC
Start: 2018-10-28 — End: 2019-01-27

## 2019-01-08 ENCOUNTER — Other Ambulatory Visit
Admission: RE | Admit: 2019-01-08 | Discharge: 2019-01-08 | Disposition: A | Discharge status: Home / Self Care | Payer: Medicare Other | Source: Ambulatory Visit | Attending: Infectious Diseases | Admitting: Infectious Diseases

## 2019-01-08 ENCOUNTER — Other Ambulatory Visit: Payer: Self-pay | Admitting: Licensed Clinical Social Worker

## 2019-01-08 DIAGNOSIS — B20 Human immunodeficiency virus [HIV] disease: Secondary | ICD-10-CM

## 2019-01-08 LAB — CBC WITH DIFFERENTIAL/PLATELET
Abs Immature Granulocytes: 0.01 10*3/uL (ref 0.00–0.07)
Basophils Absolute: 0 10*3/uL (ref 0.0–0.1)
Basophils Relative: 1 %
Eosinophils Absolute: 0 10*3/uL (ref 0.0–0.5)
Eosinophils Relative: 1 %
HCT: 38.1 % (ref 36.0–46.0)
Hemoglobin: 13.1 g/dL (ref 12.0–15.0)
Immature Granulocytes: 0 %
Lymphocytes Relative: 45 %
Lymphs Abs: 1.5 10*3/uL (ref 0.7–4.0)
MCH: 29.4 pg (ref 26.0–34.0)
MCHC: 34.4 g/dL (ref 30.0–36.0)
MCV: 85.6 fL (ref 80.0–100.0)
Monocytes Absolute: 0.2 10*3/uL (ref 0.1–1.0)
Monocytes Relative: 6 %
Neutro Abs: 1.6 10*3/uL — ABNORMAL LOW (ref 1.7–7.7)
Neutrophils Relative %: 47 %
Platelets: 192 10*3/uL (ref 150–400)
RBC: 4.45 MIL/uL (ref 3.87–5.11)
RDW: 13.1 % (ref 11.5–15.5)
WBC: 3.4 10*3/uL — ABNORMAL LOW (ref 4.0–10.5)
nRBC: 0 % (ref 0.0–0.2)

## 2019-01-08 LAB — LIPID PANEL
Cholesterol: 150 mg/dL (ref 0–200)
HDL: 58 mg/dL (ref >40)
LDL Cholesterol: 81 mg/dL (ref 0–99)
Total CHOL/HDL Ratio: 2.6 RATIO
Triglycerides: 55 mg/dL (ref <150)
VLDL: 11 mg/dL (ref 0–40)

## 2019-01-08 LAB — COMPREHENSIVE METABOLIC PANEL
ALT: 45 U/L — ABNORMAL HIGH (ref 0–44)
AST: 50 U/L — ABNORMAL HIGH (ref 15–41)
Albumin: 3.9 g/dL (ref 3.5–5.0)
Alkaline Phosphatase: 96 U/L (ref 38–126)
Anion gap: 7 (ref 5–15)
BUN: 16 mg/dL (ref 8–23)
CO2: 29 mmol/L (ref 22–32)
Calcium: 9.6 mg/dL (ref 8.9–10.3)
Chloride: 104 mmol/L (ref 98–111)
Creatinine, Ser: 1.11 mg/dL — ABNORMAL HIGH (ref 0.44–1.00)
GFR calc Af Amer: 58 mL/min — ABNORMAL LOW (ref >60)
GFR calc non Af Amer: 50 mL/min — ABNORMAL LOW (ref >60)
Glucose, Bld: 104 mg/dL — ABNORMAL HIGH (ref 70–99)
Potassium: 5 mmol/L (ref 3.5–5.1)
Sodium: 140 mmol/L (ref 135–145)
Total Bilirubin: 1.7 mg/dL — ABNORMAL HIGH (ref 0.3–1.2)
Total Protein: 8.1 g/dL (ref 6.5–8.1)

## 2019-01-09 LAB — T-HELPER CELLS CD4/CD8 %
% CD 4 Pos. Lymph.: 31.8 % (ref 30.8–58.5)
Absolute CD 4 Helper: 509 /uL (ref 359–1519)
Basophils Absolute: 0 10*3/uL (ref 0.0–0.2)
Basos: 1 %
CD3+CD4+ Cells/CD3+CD8+ Cells Bld: 1.55 (ref 0.92–3.72)
CD3+CD8+ Cells # Bld: 328 /uL (ref 109–897)
CD3+CD8+ Cells NFr Bld: 20.5 % (ref 12.0–35.5)
EOS (ABSOLUTE): 0 10*3/uL (ref 0.0–0.4)
Eos: 0 %
Hematocrit: 37.9 % (ref 34.0–46.6)
Hemoglobin: 13.1 g/dL (ref 11.1–15.9)
Immature Grans (Abs): 0 10*3/uL (ref 0.0–0.1)
Immature Granulocytes: 0 %
Lymphocytes Absolute: 1.6 10*3/uL (ref 0.7–3.1)
Lymphs: 46 %
MCH: 30.1 pg (ref 26.6–33.0)
MCHC: 34.6 g/dL (ref 31.5–35.7)
MCV: 87 fL (ref 79–97)
Monocytes Absolute: 0.3 10*3/uL (ref 0.1–0.9)
Monocytes: 8 %
Neutrophils Absolute: 1.5 10*3/uL (ref 1.4–7.0)
Neutrophils: 45 %
Platelets: 199 10*3/uL (ref 150–450)
RBC: 4.35 x10E6/uL (ref 3.77–5.28)
RDW: 13.5 % (ref 11.7–15.4)
WBC: 3.4 10*3/uL (ref 3.4–10.8)

## 2019-01-09 LAB — HIV-1 RNA QUANT-NO REFLEX-BLD
HIV 1 RNA Quant: <20 copies/mL
LOG10 HIV-1 RNA: UNDETERMINED log10copy/mL

## 2019-01-21 ENCOUNTER — Encounter: Payer: Self-pay | Admitting: Infectious Diseases

## 2019-01-21 ENCOUNTER — Ambulatory Visit: Payer: Medicare Other | Attending: Infectious Diseases | Admitting: Infectious Diseases

## 2019-01-21 ENCOUNTER — Other Ambulatory Visit: Payer: Self-pay

## 2019-01-21 VITALS — BP 124/85 | HR 60 | Temp 98.0°F | Ht 65.0 in | Wt 225.0 lb

## 2019-01-21 DIAGNOSIS — Z21 Asymptomatic human immunodeficiency virus [HIV] infection status: Secondary | ICD-10-CM | POA: Diagnosis not present

## 2019-01-21 DIAGNOSIS — Z6837 Body mass index (BMI) 37.0-37.9, adult: Secondary | ICD-10-CM

## 2019-01-21 DIAGNOSIS — K754 Autoimmune hepatitis: Secondary | ICD-10-CM

## 2019-01-21 DIAGNOSIS — Z9884 Bariatric surgery status: Secondary | ICD-10-CM

## 2019-01-21 DIAGNOSIS — Z79899 Other long term (current) drug therapy: Secondary | ICD-10-CM

## 2019-01-21 DIAGNOSIS — Z90710 Acquired absence of both cervix and uterus: Secondary | ICD-10-CM

## 2019-01-21 DIAGNOSIS — I10 Essential (primary) hypertension: Secondary | ICD-10-CM | POA: Diagnosis not present

## 2019-01-21 DIAGNOSIS — B20 Human immunodeficiency virus [HIV] disease: Secondary | ICD-10-CM

## 2019-01-21 DIAGNOSIS — Z8679 Personal history of other diseases of the circulatory system: Secondary | ICD-10-CM

## 2019-01-21 NOTE — Patient Instructions (Signed)
Becky Lee are here for follow up- you are on Biktarvy. Even before biktarvy was tarted ( while you were on Rilpavarine+FTC+TAF) your cd4 count dropped from 1000- 548 and also your total WBC dropped to 3.4. Will check your labs again in 2 months

## 2019-01-21 NOTE — Progress Notes (Signed)
NAME: Becky Lee  DOB: 12/11/1945  MRN: [id number]  Date/Time: 01/21/2019 9:22 AM Subjective:  Follow-up visit for HIV. ? Becky Lee is a 73 y.o. with a history of HIV, HTN, Morbid obesity, had lap band surgery, port infection, removal of band in 2018, autoimmune hepatitis.  Last saw her i on May 14, 2018.  Since then she had  had a Roux-en-Y gastric bypass surgery it was performed on 05/30/2018 by Dr. Carmelina Noun at [facility] for bariatric surgery in [place]. No complications with the procedure. She is taking Descovy plus dolutegravir and is 100% adherent.    She was diagnosed in 1984 and she states it was secondary to a blood transfusion she had poor total abdominal hysterectomy.  She remembers that her nadir CD4 was in single digits.  She was in [place] then.   Her cd4 1118 and Vl < 20 on Sep 07, 2017.. In February 2020 before her heart was changed from the regimen of Odefsey to dolutegravir plus Descovy her viral load was undetectable but her CD4 count had dropped to 500s and her percentage had dropped to 38% from the previous 41%.  Also noted was her total WBC also had dropped to 3.4. Her last labs done on January 08, 2019 shows a CD4 of 509 (31.8%), viral load is undetectable she is 100% adherent to her medication.  She has no side effects from her current medication.   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 Not sexually active in many years now  AIDS diagnosed 1984 Nadir Cd4 <50 OI - zoster,  HAARt history- many regimens Combivir, crixivan, atripla Acquired thru- blood transfusion for TAH in 1884 Genotype unknown ? Past Medical History:  Diagnosis Date  . HIV (human immunodeficiency virus infection) ([place])   . Hypertension    morbid obesity Autoimmune hepatitis  In 2008- took steroids and azothiprine Fibroid uterus with mennorhagia   Past  Surgical History:  Procedure Laterality Date  . 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  FH: alcoholism- deceased-brother Liver /lung cancer dad CVA-mother  Allergy-NKDA ? Meds Metoprolol Descovy+Tivicay ramipril    REVIEW OF SYSTEMS:  Const: negative fever, negative chills, positive weight gain Eyes: negative diplopia or visual changes, negative eye pain ENT: negative coryza, negative sore throat Resp: negative cough, hemoptysis, dyspnea Cards: negative for chest pain, palpitations, lower extremity edema GU: negative for frequency, dysuria and hematuria Skin: negative for rash and pruritus Heme: negative for easy bruising and gum/nose bleeding MS: negative for myalgias, arthralgias, back pain and muscle weakness Neurolo:negative for headaches, dizziness, vertigo, memory problems  Psych: negative for feelings of anxiety, depression   Objective:  VITALS:  BP 124/85 (BP Location: Right Arm, Patient Position: Sitting, Cuff Size: Large)   Pulse 60   Temp 98 F (36.7 C)   Ht 5\' 5"  (1.651 m)   Wt 225 lb (102.1 kg)   BMI 37.44 kg/m  PHYSICAL EXAM:  General: Alert, cooperative, no distress, appears stated age.  Head: Normocephalic, without obvious abnormality, atraumatic. Eyes: Conjunctivae clear, anicteric sclerae. Pupils are equal Nose: Nares normal. No drainage or sinus tenderness. Throat: Lips, mucosa, and tongue normal.  No Thrush Neck: Supple, symmetrical, no adenopathy, thyroid: non tender no carotid bruit and no JVD. Back: No CVA tenderness. Lungs: Clear to auscultation bilaterally. No Wheezing or Rhonchi. No rales. Breast NAD Heart: Regular rate and rhythm, no murmur, rub or gallop. Abdomen: Soft, non-tender,not distended. Bowel sounds  normal. No masses, lap scars Extremities: Extremities normal, atraumatic, no cyanosis. No edema. No clubbing Skin: No rashes or lesions. Not Jaundiced Lymph: Cervical, supraclavicular normal. Neurologic: Grossly non-focal  Health maintenance Vaccination  pneumovac- 23-9/26/19 Prevnar-13-11/30/18 HepB unknown HepA unknown TdaP not known Flu this season Herpes zoster-  ______________________ labs RPR-NR on 05/14/2018 HEPC ab-NR on 05/14/2018 Lipid- 12/27/17- TC 167,TGL 81, HDL 48.3, LDL 103, VLDL 16  HIV VL<20 Cd4 519] 31.8% quantiferon Gold-nonreactive from 05/14/18 [id number] Geno sure archive done on 05/14/2018 does not show significant mutation.  HIV antibody TSH 3.527 ( 12/27/17)  Preventive  Dental Colonoscopy Opthal cervical pap-not sexually active and has not had it in many years Mammogram-not done  Impression/Recommendation ?73 year old female with history of HIV AIDS diagnosed in the 24s and now on Odefsey and 100% adherent to the medication.   ? ?HIV AIDS: Last viral load is less than 20 and but CD4 has dropped from 1000 119 in 2019-5 009 in September 2020.  The percentage also has dropped from 41-31.  Unclear etiology.  Viral load is undetectable. Her WBC on the whole has dropped to 3.4 Need to keep a close eye on this. Currently no evidence of any lymphoma or other bone marrow suppressing condition  Hypertension on metoprolol and ramipril History of coronary artery disease diagnosed when she had a work-up for the first lap band surgery  History of autoimmune hepatitis was on prednisone and azathioprine when she was in [place] but not anymore.  There is a possibility that it was drug-induced hepatitis.   On September 2019 LFTs were normal.  Her last LFT from 01/08/2019 shows total bilirubin of 1.7, AST of 50 and ALT of 45 which have very mildly elevated.  Obesity: Had lap band surgery in 2008 and it was removed in 2018 because of the port infection.  On 05/30/2018  she underwent Roux-en-Y bypass  Discussed the management with the patient. We will check her labs in 2 months time to see what her LFTs and CD4 is doing.

## 2019-01-27 ENCOUNTER — Other Ambulatory Visit: Payer: Self-pay | Admitting: Licensed Clinical Social Worker

## 2019-01-27 DIAGNOSIS — B20 Human immunodeficiency virus [HIV] disease: Secondary | ICD-10-CM

## 2019-01-27 MED ORDER — DESCOVY 200-25 MG PO TABS: 1.0000 | ORAL_TABLET | Freq: Every day | ORAL | 3 refills | Status: DC

## 2019-01-27 MED ORDER — DOLUTEGRAVIR SODIUM 50 MG PO TABS: 50.0000 mg | ORAL_TABLET | Freq: Every day | ORAL | 3 refills | Status: DC

## 2019-03-12 ENCOUNTER — Other Ambulatory Visit: Payer: Self-pay | Admitting: Internal Medicine

## 2019-03-12 DIAGNOSIS — Z1231 Encounter for screening mammogram for malignant neoplasm of breast: Secondary | ICD-10-CM

## 2019-03-19 ENCOUNTER — Ambulatory Visit
Admission: RE | Admit: 2019-03-19 | Discharge: 2019-03-19 | Disposition: A | Discharge status: Home / Self Care | Payer: Medicare Other | Source: Ambulatory Visit | Attending: Internal Medicine | Admitting: Internal Medicine

## 2019-03-19 DIAGNOSIS — Z1231 Encounter for screening mammogram for malignant neoplasm of breast: Secondary | ICD-10-CM | POA: Diagnosis present

## 2019-03-21 ENCOUNTER — Other Ambulatory Visit: Payer: Self-pay | Admitting: Internal Medicine

## 2019-03-21 DIAGNOSIS — N6489 Other specified disorders of breast: Secondary | ICD-10-CM

## 2019-03-21 DIAGNOSIS — R928 Other abnormal and inconclusive findings on diagnostic imaging of breast: Secondary | ICD-10-CM

## 2019-03-31 DIAGNOSIS — K2970 Gastritis, unspecified, without bleeding: Secondary | ICD-10-CM | POA: Insufficient documentation

## 2019-04-01 ENCOUNTER — Ambulatory Visit: Payer: Medicare Other | Attending: Infectious Diseases | Admitting: Infectious Diseases

## 2019-04-01 ENCOUNTER — Other Ambulatory Visit: Payer: Self-pay

## 2019-04-01 DIAGNOSIS — B20 Human immunodeficiency virus [HIV] disease: Secondary | ICD-10-CM | POA: Diagnosis not present

## 2019-04-01 DIAGNOSIS — K754 Autoimmune hepatitis: Secondary | ICD-10-CM | POA: Diagnosis not present

## 2019-04-01 DIAGNOSIS — Z7189 Other specified counseling: Secondary | ICD-10-CM

## 2019-04-01 DIAGNOSIS — Z79899 Other long term (current) drug therapy: Secondary | ICD-10-CM

## 2019-04-01 DIAGNOSIS — Z9884 Bariatric surgery status: Secondary | ICD-10-CM

## 2019-04-01 DIAGNOSIS — Z7185 Encounter for immunization safety counseling: Secondary | ICD-10-CM

## 2019-04-01 NOTE — Progress Notes (Signed)
The purpose of this virtual visit is to provide medical care while limiting exposure to the novel coronavirus (COVID19) for both patient and office staff.   Consent was obtained for TELEvist visit:  Yes.   Answered questions that patient had about telehealth interaction:  Yes.   I discussed the limitations, risks, security and privacy concerns of performing an evaluation and management service by telephone. I also discussed with the patient that there may be a patient responsible charge related to this service. The patient expressed understanding and agreed to proceed.   Patient Location: Home Provider Location:office Alvin Rubano is a 73 yr female with h/o well controlled HIV, H/o bariatric lap band surgery with Port infection and removal of the lap band in 2018, autoimmune hepatitis, Roux en y gastric bypass surgery on 05/30/18  On Descovy and [place] visit for counseling for Corona virus vaccine. 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 The reactogenicity can be seen in 1in 5 people with local symptoms including pain, swelling, lymphadenopathy and sometimes mild to mod fever. No contraindication to the vaccine in patients with HIV- There were people with HIV included in both clinical trials. Long term safety and efficacy of the vaccine unknown currently. The benefit of the vaccine outweighs the risk because of the raging pandemic and the risk of severity of illness in people with > 2 comorbidities  No drug interaction with vaccine Simple strategies to reduce the reactogenicity like tylenol discussed.  Answered her questions

## 2019-04-02 ENCOUNTER — Ambulatory Visit
Admission: RE | Admit: 2019-04-02 | Discharge: 2019-04-02 | Disposition: A | Discharge status: Home / Self Care | Payer: Medicare Other | Source: Ambulatory Visit | Attending: Internal Medicine | Admitting: Internal Medicine

## 2019-04-02 DIAGNOSIS — R928 Other abnormal and inconclusive findings on diagnostic imaging of breast: Secondary | ICD-10-CM | POA: Diagnosis present

## 2019-04-02 DIAGNOSIS — N6489 Other specified disorders of breast: Secondary | ICD-10-CM | POA: Diagnosis present

## 2019-06-26 ENCOUNTER — Telehealth: Payer: Self-pay

## 2019-06-26 NOTE — Telephone Encounter (Signed)
LM stating she sent mychart message week ago and never got answer. I do not see that in her chart. I called her to discuss and left VM stating we did not receive mychart message and they all are saved to chart even while waiting to be answered. I apologized that she had to wait and there was an issue and gave her contact info to call me back.

## 2019-07-07 ENCOUNTER — Other Ambulatory Visit: Payer: Self-pay | Admitting: Infectious Diseases

## 2019-07-07 DIAGNOSIS — B20 Human immunodeficiency virus [HIV] disease: Secondary | ICD-10-CM

## 2019-07-07 MED ORDER — DOLUTEGRAVIR SODIUM 50 MG PO TABS: 50.0000 mg | ORAL_TABLET | Freq: Every day | ORAL | 3 refills | Status: DC

## 2019-07-07 MED ORDER — DESCOVY 200-25 MG PO TABS: 1.0000 | ORAL_TABLET | Freq: Every day | ORAL | 3 refills | Status: DC

## 2019-07-10 ENCOUNTER — Other Ambulatory Visit
Admission: RE | Admit: 2019-07-10 | Discharge: 2019-07-10 | Disposition: A | Discharge status: Home / Self Care | Payer: Medicare Other | Source: Ambulatory Visit | Attending: Infectious Diseases | Admitting: Infectious Diseases

## 2019-07-10 DIAGNOSIS — B20 Human immunodeficiency virus [HIV] disease: Secondary | ICD-10-CM | POA: Insufficient documentation

## 2019-07-10 LAB — COMPREHENSIVE METABOLIC PANEL
ALT: 32 U/L (ref 0–44)
AST: 34 U/L (ref 15–41)
Albumin: 3.8 g/dL (ref 3.5–5.0)
Alkaline Phosphatase: 97 U/L (ref 38–126)
Anion gap: 8 (ref 5–15)
BUN: 15 mg/dL (ref 8–23)
CO2: 27 mmol/L (ref 22–32)
Calcium: 9.2 mg/dL (ref 8.9–10.3)
Chloride: 105 mmol/L (ref 98–111)
Creatinine, Ser: 1 mg/dL (ref 0.44–1.00)
GFR calc Af Amer: >60 mL/min (ref >60)
GFR calc non Af Amer: 57 mL/min — ABNORMAL LOW (ref >60)
Glucose, Bld: 88 mg/dL (ref 70–99)
Potassium: 4.1 mmol/L (ref 3.5–5.1)
Sodium: 140 mmol/L (ref 135–145)
Total Bilirubin: 1.1 mg/dL (ref 0.3–1.2)
Total Protein: 8 g/dL (ref 6.5–8.1)

## 2019-07-10 NOTE — Addendum Note (Signed)
Addended by: Deloria Lair on: 07/10/2019 03:23 PM   Modules accepted: Orders

## 2019-07-10 NOTE — Addendum Note (Signed)
Addended by: Jammy Plotkin on: 07/10/2019 03:23 PM   Modules accepted: Orders  

## 2019-07-11 LAB — T-HELPER CELLS CD4/CD8 %
% CD 4 Pos. Lymph.: 40.1 % (ref 30.8–58.5)
Absolute CD 4 Helper: 842 /uL (ref 359–1519)
Basophils Absolute: 0 10*3/uL (ref 0.0–0.2)
Basos: 1 %
CD3+CD4+ Cells/CD3+CD8+ Cells Bld: 1.65 (ref 0.92–3.72)
CD3+CD8+ Cells # Bld: 510 /uL (ref 109–897)
CD3+CD8+ Cells NFr Bld: 24.3 % (ref 12.0–35.5)
EOS (ABSOLUTE): 0 10*3/uL (ref 0.0–0.4)
Eos: 1 %
Hematocrit: 38.1 % (ref 34.0–46.6)
Hemoglobin: 12.9 g/dL (ref 11.1–15.9)
Immature Grans (Abs): 0 10*3/uL (ref 0.0–0.1)
Immature Granulocytes: 0 %
Lymphocytes Absolute: 2.1 10*3/uL (ref 0.7–3.1)
Lymphs: 54 %
MCH: 29.2 pg (ref 26.6–33.0)
MCHC: 33.9 g/dL (ref 31.5–35.7)
MCV: 86 fL (ref 79–97)
Monocytes Absolute: 0.3 10*3/uL (ref 0.1–0.9)
Monocytes: 7 %
Neutrophils Absolute: 1.4 10*3/uL (ref 1.4–7.0)
Neutrophils: 37 %
Platelets: 181 10*3/uL (ref 150–450)
RBC: 4.42 x10E6/uL (ref 3.77–5.28)
RDW: 13.5 % (ref 11.7–15.4)
WBC: 3.9 10*3/uL (ref 3.4–10.8)

## 2019-07-22 ENCOUNTER — Ambulatory Visit: Payer: Medicare Other | Admitting: Infectious Diseases

## 2019-07-29 ENCOUNTER — Other Ambulatory Visit: Payer: Self-pay

## 2019-07-29 ENCOUNTER — Encounter: Payer: Self-pay | Admitting: Infectious Diseases

## 2019-07-29 ENCOUNTER — Ambulatory Visit: Payer: Medicare Other | Attending: Infectious Diseases | Admitting: Infectious Diseases

## 2019-07-29 DIAGNOSIS — I10 Essential (primary) hypertension: Secondary | ICD-10-CM | POA: Insufficient documentation

## 2019-07-29 DIAGNOSIS — Z79899 Other long term (current) drug therapy: Secondary | ICD-10-CM | POA: Insufficient documentation

## 2019-07-29 DIAGNOSIS — Z6835 Body mass index (BMI) 35.0-35.9, adult: Secondary | ICD-10-CM | POA: Insufficient documentation

## 2019-07-29 DIAGNOSIS — Z801 Family history of malignant neoplasm of trachea, bronchus and lung: Secondary | ICD-10-CM | POA: Diagnosis not present

## 2019-07-29 DIAGNOSIS — Z823 Family history of stroke: Secondary | ICD-10-CM | POA: Insufficient documentation

## 2019-07-29 DIAGNOSIS — B20 Human immunodeficiency virus [HIV] disease: Secondary | ICD-10-CM | POA: Insufficient documentation

## 2019-07-29 DIAGNOSIS — Z9884 Bariatric surgery status: Secondary | ICD-10-CM | POA: Insufficient documentation

## 2019-07-29 DIAGNOSIS — Z90710 Acquired absence of both cervix and uterus: Secondary | ICD-10-CM | POA: Insufficient documentation

## 2019-07-29 DIAGNOSIS — Z800 Family history of malignant neoplasm of digestive organs: Secondary | ICD-10-CM | POA: Insufficient documentation

## 2019-07-29 MED ORDER — DESCOVY 200-25 MG PO TABS: 1.0000 | ORAL_TABLET | Freq: Every day | ORAL | 1 refills | Status: DC

## 2019-07-29 MED ORDER — DOLUTEGRAVIR SODIUM 50 MG PO TABS: 50.0000 mg | ORAL_TABLET | Freq: Every day | ORAL | 1 refills | Status: DC

## 2019-07-29 NOTE — Progress Notes (Signed)
NAME: Becky Lee  DOB: 07-01-1945  MRN: [id number]  Date/Time: 07/29/2019 9:15 AM Subjective:  Follow-up visit for HIV. ? Becky Lee is a 74 y.o. with a history of HIV, HTN, Morbid obesity, had lap band surgery, port infection, removal of band in 2018, autoimmune hepatitis.  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 Carmelina Noun at [facility] for bariatric surgery in [place].   She is taking Descovy plus dolutegravir and is 100% adherent. No side effects She had issues with getting the meds in [place] and had to go on odefsey for 3 days   She was diagnosed in 1984 and she states it was secondary to a blood transfusion she had poor total abdominal hysterectomy.  She remembers that her nadir CD4 was in single digits.  She was in [place] then.   Her cd4 1118 and Vl < 20 on Sep 07, 2017.. 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 Not sexually active in many years now  AIDS diagnosed 1984 Nadir Cd4 <50 OI - zoster,  HAARt history- many regimens Combivir, crixivan, atripla odefsey Current- Descovy + tivicay Acquired thru- blood transfusion for TAH in 1984  ? Past Medical History:  Diagnosis Date  . HIV (human immunodeficiency virus infection) ([place])   . Hypertension    morbid obesity Autoimmune hepatitis  In 2008- took steroids and azothiprine Fibroid uterus with mennorhagia 12/19/ 2019- SPECT study FINDINGS: Regional wall motion: reveals normal myocardial thickening and wall  motion.The overall quality of the study is good.  Left ventricular cavity: normal. Perfusion Analysis: SPECT images demonstrate homogeneous tracer  distribution throughout the myocardium.   Past Surgical History:  Procedure Laterality Date  . 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  FH: alcoholism- deceased-brother Liver /lung cancer dad CVA-mother  Allergy-NKDA ? Meds Metoprolol Descovy+Tivicay ramipril  Calcium citrate  MVT ( bariatric advantage multivitamin)   REVIEW OF SYSTEMS:  Const: negative fever, negative chills, positive weight gain Eyes: negative diplopia or visual changes, negative eye pain ENT: has coryza, negative sore throat Broken teeth- getting a new plate Resp: negative cough, hemoptysis, dyspnea Cards: negative for chest pain, palpitations, lower extremity edema GU: negative for frequency, dysuria and hematuria Skin: negative for rash and pruritus Heme: negative for easy bruising and gum/nose bleeding MS: negative for myalgias, arthralgias, back pain and muscle weakness Neurolo:negative for headaches, dizziness, vertigo, memory problems  Psych: negative for feelings of anxiety, depression   Objective:  VITALS:  BP 129/89   Pulse (!) 58   Temp 98.1 F (36.7 C) (Oral)   Resp 16   Ht 5\' 5"  (1.651 m)   Wt 215 lb (97.5 kg)   SpO2 98%   BMI 35.78 kg/m  PHYSICAL EXAM:  General: Alert, cooperative, no distress, appears stated age.  Head: Normocephalic, without obvious abnormality, atraumatic. Eyes: Conjunctivae clear, anicteric sclerae. Pupils are equal Nose: Nares normal. No drainage or sinus tenderness. Throat: Lips, mucosa, and tongue normal. No Thrush Neck: Supple, symmetrical, no adenopathy, thyroid: non  tender no carotid bruit and no JVD. Back: No CVA tenderness. Lungs: Clear to auscultation bilaterally. No Wheezing or Rhonchi. No rales. Breast NAD Heart: Regular rate and rhythm, no murmur, rub or gallop. Abdomen: Soft, non-tender,not distended. Bowel  sounds normal. No masses, lap scars Extremities: Extremities normal, atraumatic, no cyanosis. No edema. No clubbing Skin: No rashes or lesions. Not Jaundiced Lymph: Cervical, supraclavicular normal. Neurologic: Grossly non-focal  Health maintenance Vaccination  pneumovac- 23-9/26/19 Prevnar-13-11/30/18 HepB unknown HepA unknown TdaP not known Flu this season Herpes zoster-  ______________________ labs RPR-NR on 05/14/2018 HEPC ab-NR on 05/14/2018 Lipid- 06/13/19 - TC 142,TGL 60, HDL 60, LDL 70, HIV VL<20 Cd4  848 ( 40%) April 2021 quantiferon Gold-nonreactive from 05/14/18 [id number] Geno sure archive done on 05/14/2018 does not show significant mutation.  HIV antibody TSH 1.25 ( 06/13/19) ------------------------------------------------------------  Preventive  Dental- followed currently at dental works Colonoscopy Opthal cervical pap-not sexually active and has not had it in many years Mammogram-04/02/19  Impression/Recommendation ?74 year old female with history of HIV AIDS diagnosed in the 64s   ? ?HIV AIDS: Last viral load is less than 20 and CD4 is 842 ( 40%)  Her WBC on the whole has dropped to < 4 since Feb 2020 and that is making her cd4 count to drop as well ( but % is fine) She had bariatric surgery in Feb 2020. Recent labs from March 2021 shows normal B12, folate, zinc, copper, B1, A etc Currently no evidence of any lymphoma or other bone marrow suppressing condition-may refer to heme onc  Hypertension on metoprolol and ramipril History of coronary artery disease diagnosed when she had a work-up for the first lap band surgery SPECT scan done 03/28/18 was normal and she is now discharged from the cardiologist clinic  History of autoimmune hepatitis was on prednisone and azathioprine when she was in [place] but not anymore.  There is a possibility that it was drug-induced hepatitis.( AST 34, ALT 32 and bili 1.1--all normal in April 2021)    Obesity: Had lap band  surgery in 2008 and it was removed in 2018 because of the port infection.  On 05/30/2018 she underwent Roux-en-Y bypass  Discussed the management with the patient. Follow up 6 months Will need VL, RPR, Quantiferon gold etc Called [facility] [place] to synchronize both her prescriptions so that she will get Descovy/tivicay at the same time- they are giving her a 90 day supply at one time

## 2019-07-29 NOTE — Patient Instructions (Addendum)
You are here for follow up of HIV. Last Cd4  Is 840 and Vl < 20. Will continue descovy+ tivicay. Please hydrate yourself well with water.

## 2020-01-29 ENCOUNTER — Other Ambulatory Visit: Payer: Self-pay

## 2020-01-29 ENCOUNTER — Ambulatory Visit: Payer: Medicare Other | Attending: Infectious Diseases | Admitting: Infectious Diseases

## 2020-01-29 ENCOUNTER — Encounter: Payer: Self-pay | Admitting: Infectious Diseases

## 2020-01-29 VITALS — BP 136/86 | HR 64 | Resp 16 | Ht 65.0 in | Wt 219.0 lb

## 2020-01-29 DIAGNOSIS — Z79899 Other long term (current) drug therapy: Secondary | ICD-10-CM | POA: Diagnosis not present

## 2020-01-29 DIAGNOSIS — Z6836 Body mass index (BMI) 36.0-36.9, adult: Secondary | ICD-10-CM | POA: Diagnosis not present

## 2020-01-29 DIAGNOSIS — B20 Human immunodeficiency virus [HIV] disease: Secondary | ICD-10-CM | POA: Diagnosis present

## 2020-01-29 DIAGNOSIS — Z7901 Long term (current) use of anticoagulants: Secondary | ICD-10-CM | POA: Insufficient documentation

## 2020-01-29 DIAGNOSIS — Z9884 Bariatric surgery status: Secondary | ICD-10-CM | POA: Insufficient documentation

## 2020-01-29 DIAGNOSIS — I10 Essential (primary) hypertension: Secondary | ICD-10-CM | POA: Insufficient documentation

## 2020-01-29 DIAGNOSIS — I2510 Atherosclerotic heart disease of native coronary artery without angina pectoris: Secondary | ICD-10-CM | POA: Diagnosis not present

## 2020-01-29 NOTE — Patient Instructions (Signed)
Ou are here for a routine visit- doing well- update on vaccine- wll do labs at your convenience- follow up 6 months

## 2020-01-29 NOTE — Progress Notes (Signed)
NAME: Becky Lee  DOB: 04/30/1945  MRN: [id number]  Date/Time: 01/29/2020 9:52 AM Subjective:  Follow-up visit for HIV. ? Becky Lee is a 74 y.o. with a history of HIV, HTN, Morbid obesity, had lap band surgery, port infection, removal of band in 2018, autoimmune hepatitis.  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 Barkley Boards at [facility] for bariatric surgery in [place]. She is doing well No issues since I last saw her April 2021  She is taking Descovy plus dolutegravir and is 100% adherent. No side effects No new medical issues   She was diagnosed in 1984 and she states it was secondary to a blood transfusion she had poor total abdominal hysterectomy.  She remembers that her nadir CD4 was in single digits.  She was in [place] then.   Her cd4 1118 and Vl < 20 on Sep 07, 2017.. 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 Not sexually active in many years now  AIDS diagnosed 1984 Nadir Cd4 <50 OI - zoster,  HAARt history- many regimens Combivir, crixivan, atripla odefsey Current- Descovy + tivicay Acquired thru- blood transfusion for TAH in 1984  ? Past Medical History:  Diagnosis Date  . HIV (human immunodeficiency virus infection) ([facility])   . Hypertension    morbid obesity Autoimmune hepatitis  In 2008- took steroids and azothiprine Fibroid uterus with mennorhagia 12/19/ 2019- SPECT study FINDINGS: Regional wall motion: reveals normal myocardial thickening and wall  motion.The overall quality of the study is good.  Left ventricular cavity: normal. Perfusion Analysis: SPECT images demonstrate homogeneous tracer  distribution throughout the myocardium.   Past Surgical History:  Procedure Laterality Date  . 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  FH: alcoholism- deceased-brother Liver /lung cancer dad CVA-mother  Allergy-NKDA ? Meds Metoprolol Descovy+Tivicay ramipril  Calcium citrate  MVT ( bariatric advantage multivitamin)   REVIEW OF SYSTEMS:  Const: negative fever, negative chills, positive weight gain Eyes: negative diplopia or visual changes, negative eye pain ENT: has coryza, negative sore throat Broken teeth- getting a new plate Resp: negative cough, hemoptysis, dyspnea Cards: negative for chest pain, palpitations, lower extremity edema GU: negative for frequency, dysuria and hematuria Skin: negative for rash and pruritus Heme: negative for easy bruising and gum/nose bleeding MS: negative for myalgias, arthralgias, back pain and muscle weakness Neurolo:negative for headaches, dizziness, vertigo, memory problems  Psych: negative for feelings of anxiety, depression   Objective:  VITALS:  BP 136/86   Pulse 64   Resp 16   Ht 5\' 5"  (1.651 m)   Wt 219 lb (99.3 kg)   SpO2 99%   BMI 36.44 kg/m  PHYSICAL EXAM:  General: Alert, cooperative, no distress, appears stated age.  Head: Normocephalic, without obvious abnormality, atraumatic. Eyes: Conjunctivae clear, anicteric sclerae. Pupils are equal Nose: Nares normal. No drainage or sinus tenderness. Throat: Lips, mucosa, and tongue normal. No Thrush Neck: Supple, symmetrical, no adenopathy, thyroid: non tender no carotid bruit and no  JVD. Back: No CVA tenderness. Lungs: Clear to auscultation bilaterally. No Wheezing or Rhonchi. No rales. Breast NAD Heart: Regular rate and rhythm, no murmur, rub or gallop. Abdomen: Soft, non-tender,not distended.  Bowel sounds normal. No masses, lap scars Extremities: Extremities normal, atraumatic, no cyanosis. No edema. No clubbing Skin: No rashes or lesions. Not Jaundiced Lymph: Cervical, supraclavicular normal. Neurologic: Grossly non-focal  Health maintenance Vaccination  pneumovac- 23-9/26/19 Prevnar-13-11/30/18 HepB unknown HepA unknown TdaP not known Flu this season Herpes zoster- MRNA vaccine ( Moderna X2) Flu vaccine 2021 ______________________ labs RPR-NR on 05/14/2018 [facility]PC ab-NR on 05/14/2018 Lipid- 06/13/19 - TC 142,TGL 60, HDL 60, LDL 70, HIV VL<20 Cd4  848 ( 40%) April 2021 quantiferon Gold-nonreactive from 05/14/18 [id number] Geno sure archive done on 05/14/2018 does not show significant mutation.  HIV antibody TSH 1.25 ( 06/13/19) ------------------------------------------------------------  Preventive  Dental- followed currently at dental works Colonoscopy Opthal cervical pap-not sexually active and has not had it in many years Mammogram-04/02/19  Impression/Recommendation ?74 year old female with history of HIV AIDS diagnosed in the 42s   ? ?HIV AIDS: Last viral load is less than 20 and CD4 is 842 ( 40%)  Her WBC on the whole has dropped to < 4 since Feb 2020 and that is making her cd4 count to drop as well ( but % is fine) She had bariatric surgery in Feb 2020. Recent labs from March 2021 shows normal B12, folate, zinc, copper, B1, A etc Currently no evidence of any lymphoma or other bone marrow suppressing condition-  Hypertension on metoprolol and ramipril History of coronary artery disease diagnosed when she had a work-up for the first lap band surgery SPECT scan done 03/28/18 was normal and she is now discharged from the cardiologist clinic  History of autoimmune hepatitis was on prednisone and azathioprine when she was in [place] but not anymore.  There is a possibility that it was drug-induced hepatitis.( AST 34, ALT 32 and bili 1.1--all normal in April  2021)    Obesity: Had lap band surgery in 2008 and it was removed in 2018 because of the port infection.  On 05/30/2018 she underwent Roux-en-Y bypass  Discussed the management with the patient. Pt has an appt to go to now so will do labs later Follow up 6 months

## 2020-02-18 ENCOUNTER — Other Ambulatory Visit: Payer: Self-pay | Admitting: Infectious Diseases

## 2020-02-18 DIAGNOSIS — B20 Human immunodeficiency virus [HIV] disease: Secondary | ICD-10-CM

## 2020-02-18 MED ORDER — DOLUTEGRAVIR SODIUM 50 MG PO TABS: 50.0000 mg | ORAL_TABLET | Freq: Every day | ORAL | 2 refills | Status: DC

## 2020-02-18 MED ORDER — DESCOVY 200-25 MG PO TABS: 1.0000 | ORAL_TABLET | Freq: Every day | ORAL | 2 refills | Status: DC

## 2020-02-20 ENCOUNTER — Other Ambulatory Visit: Payer: Medicare Other

## 2020-02-20 DIAGNOSIS — Z20822 Contact with and (suspected) exposure to covid-19: Secondary | ICD-10-CM

## 2020-02-21 LAB — SARS-COV-2, NAA 2 DAY TAT

## 2020-02-21 LAB — NOVEL CORONAVIRUS, NAA: SARS-CoV-2, NAA: NOT DETECTED

## 2020-02-27 ENCOUNTER — Other Ambulatory Visit
Admission: RE | Admit: 2020-02-27 | Discharge: 2020-02-27 | Disposition: A | Discharge status: Home / Self Care | Payer: Medicare Other | Attending: Infectious Diseases | Admitting: Infectious Diseases

## 2020-02-27 DIAGNOSIS — B20 Human immunodeficiency virus [HIV] disease: Secondary | ICD-10-CM

## 2020-03-01 ENCOUNTER — Other Ambulatory Visit
Admission: RE | Admit: 2020-03-01 | Discharge: 2020-03-01 | Disposition: A | Discharge status: Home / Self Care | Payer: Medicare Other | Source: Ambulatory Visit | Attending: Infectious Diseases | Admitting: Infectious Diseases

## 2020-03-01 DIAGNOSIS — B20 Human immunodeficiency virus [HIV] disease: Secondary | ICD-10-CM | POA: Insufficient documentation

## 2020-03-01 LAB — COMPREHENSIVE METABOLIC PANEL
ALT: 47 U/L — ABNORMAL HIGH (ref 0–44)
AST: 48 U/L — ABNORMAL HIGH (ref 15–41)
Albumin: 3.6 g/dL (ref 3.5–5.0)
Alkaline Phosphatase: 92 U/L (ref 38–126)
Anion gap: 8 (ref 5–15)
BUN: 14 mg/dL (ref 8–23)
CO2: 29 mmol/L (ref 22–32)
Calcium: 9 mg/dL (ref 8.9–10.3)
Chloride: 103 mmol/L (ref 98–111)
Creatinine, Ser: 0.94 mg/dL (ref 0.44–1.00)
GFR, Estimated: >60 mL/min (ref >60)
Glucose, Bld: 95 mg/dL (ref 70–99)
Potassium: 3.6 mmol/L (ref 3.5–5.1)
Sodium: 140 mmol/L (ref 135–145)
Total Bilirubin: 1.7 mg/dL — ABNORMAL HIGH (ref 0.3–1.2)
Total Protein: 7.6 g/dL (ref 6.5–8.1)

## 2020-03-02 LAB — T-HELPER CELLS CD4/CD8 %
% CD 4 Pos. Lymph.: 47.6 % (ref 30.8–58.5)
Absolute CD 4 Helper: 952 /uL (ref 359–1519)
Basophils Absolute: 0 10*3/uL (ref 0.0–0.2)
Basos: 1 %
CD3+CD4+ Cells/CD3+CD8+ Cells Bld: 1.93 (ref 0.92–3.72)
CD3+CD8+ Cells # Bld: 492 /uL (ref 109–897)
CD3+CD8+ Cells NFr Bld: 24.6 % (ref 12.0–35.5)
EOS (ABSOLUTE): 0 10*3/uL (ref 0.0–0.4)
Eos: 1 %
Hematocrit: 38.4 % (ref 34.0–46.6)
Hemoglobin: 12.9 g/dL (ref 11.1–15.9)
Immature Grans (Abs): 0 10*3/uL (ref 0.0–0.1)
Immature Granulocytes: 0 %
Lymphocytes Absolute: 2 10*3/uL (ref 0.7–3.1)
Lymphs: 55 %
MCH: 29.7 pg (ref 26.6–33.0)
MCHC: 33.6 g/dL (ref 31.5–35.7)
MCV: 88 fL (ref 79–97)
Monocytes Absolute: 0.3 10*3/uL (ref 0.1–0.9)
Monocytes: 7 %
Neutrophils Absolute: 1.3 10*3/uL — ABNORMAL LOW (ref 1.4–7.0)
Neutrophils: 36 %
Platelets: 167 10*3/uL (ref 150–450)
RBC: 4.35 x10E6/uL (ref 3.77–5.28)
RDW: 13.3 % (ref 11.7–15.4)
WBC: 3.5 10*3/uL (ref 3.4–10.8)

## 2020-03-02 LAB — HIV-1 RNA QUANT-NO REFLEX-BLD
HIV 1 RNA Quant: <20 copies/mL
LOG10 HIV-1 RNA: UNDETERMINED log10copy/mL

## 2020-03-02 LAB — RPR: RPR Ser Ql: NONREACTIVE

## 2020-03-03 LAB — QUANTIFERON-TB GOLD PLUS (RQFGPL)
QuantiFERON Mitogen Value: 6.76 IU/mL
QuantiFERON Nil Value: 0.02 IU/mL
QuantiFERON TB1 Ag Value: 0.03 IU/mL
QuantiFERON TB2 Ag Value: 0.04 IU/mL

## 2020-03-03 LAB — QUANTIFERON-TB GOLD PLUS: QuantiFERON-TB Gold Plus: NEGATIVE

## 2020-08-17 ENCOUNTER — Ambulatory Visit: Payer: Medicare Other | Admitting: Infectious Diseases

## 2020-09-16 ENCOUNTER — Ambulatory Visit: Payer: Medicare Other | Attending: Infectious Diseases | Admitting: Infectious Diseases

## 2020-09-16 ENCOUNTER — Other Ambulatory Visit: Payer: Self-pay

## 2020-09-16 VITALS — BP 113/79 | HR 66 | Temp 97.0°F | Resp 16 | Ht 65.0 in | Wt 219.3 lb

## 2020-09-16 DIAGNOSIS — K754 Autoimmune hepatitis: Secondary | ICD-10-CM | POA: Diagnosis not present

## 2020-09-16 DIAGNOSIS — D72819 Decreased white blood cell count, unspecified: Secondary | ICD-10-CM | POA: Insufficient documentation

## 2020-09-16 DIAGNOSIS — I10 Essential (primary) hypertension: Secondary | ICD-10-CM | POA: Diagnosis not present

## 2020-09-16 DIAGNOSIS — Z79899 Other long term (current) drug therapy: Secondary | ICD-10-CM | POA: Insufficient documentation

## 2020-09-16 DIAGNOSIS — Z7901 Long term (current) use of anticoagulants: Secondary | ICD-10-CM | POA: Diagnosis not present

## 2020-09-16 DIAGNOSIS — Z6836 Body mass index (BMI) 36.0-36.9, adult: Secondary | ICD-10-CM | POA: Insufficient documentation

## 2020-09-16 DIAGNOSIS — I2510 Atherosclerotic heart disease of native coronary artery without angina pectoris: Secondary | ICD-10-CM | POA: Diagnosis not present

## 2020-09-16 DIAGNOSIS — B20 Human immunodeficiency virus [HIV] disease: Secondary | ICD-10-CM | POA: Diagnosis present

## 2020-09-16 NOTE — Patient Instructions (Signed)
You are here for follow up- will explore the injection and get back to you

## 2020-09-16 NOTE — Progress Notes (Signed)
NAME: Becky Lee  DOB: [birth year]  MRN: [id number]  Date/Time: 09/16/2020 9:14 AM Subjective:  Follow-up visit for HIV. ?last seen Oct 2021- history taken from last note Maretta Overdorf is a 75 y.o. with a history of HIV, HTN, Morbid obesity, had lap band surgery, port infection, removal of band in 2018, autoimmune hepatitis.  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 Barkley Boards at [facility] for bariatric surgery in [place]. She is doing well No issues since I last saw her oct 2021 [facility] cardiologist and metoprolol was dc due to fatigue and lisinopril was started for HTN  She is taking Descovy plus dolutegravir and is 100% adherent. No side effects No new medical issues   She was diagnosed in 1984 and she states it was secondary to a blood transfusion she had for total abdominal hysterectomy.  She remembers that her nadir CD4 was in single digits.  She was in [place] then.   Her cd4 1118 and Vl < 20 on Sep 07, 2017.. 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 Not sexually active in many years now  AIDS diagnosed 1984 Nadir Cd4 <50 OI - zoster,  HAARt history- many regimens Combivir, crixivan, atripla odefsey Current- Descovy + tivicay Acquired thru- blood transfusion for TAH in 1984  ? Past Medical History:  Diagnosis Date   HIV (human immunodeficiency virus infection) (HCC)    Hypertension    morbid obesity Autoimmune hepatitis  In 2008- took steroids and azothiprine Fibroid uterus with mennorhagia 12/19/ 2019- SPECT study FINDINGS: Regional wall motion:  reveals normal myocardial thickening and wall motion.The overall quality of the study is good.   Left ventricular cavity: normal. Perfusion Analysis:  SPECT  images demonstrate homogeneous tracer distribution throughout the myocardium.    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  FH: alcoholism- deceased-brother Liver /lung cancer dad CVA-mother  Allergy-NKDA ? Meds lisinopril Descovy+Tivicay Calcium citrate  MVT ( bariatric advantage multivitamin)   REVIEW OF SYSTEMS:  Const: negative fever, negative chills, positive weight gain Eyes: negative diplopia or visual changes, negative eye pain ENT: has coryza, negative sore throat Broken teeth- got a  new plate Resp: negative cough, hemoptysis, dyspnea Cards: negative for chest pain, palpitations, lower extremity edema GU: negative for frequency, dysuria and hematuria Skin: negative for rash and pruritus Heme: negative for easy bruising and gum/nose bleeding MS: negative for myalgias, arthralgias, back pain and muscle weakness Neurolo:negative for headaches, dizziness, vertigo, memory problems  Psych: negative for feelings of anxiety, depression   Objective:  VITALS:  BP 113/79   Pulse 66   Temp (!) 97 F (36.1 C) (Temporal)   Resp 16   Ht 5\' 5"  (1.651 m)   Wt 219 lb 4.8 oz (99.5 kg)   SpO2 98%   BMI 36.49 kg/m  PHYSICAL EXAM: No change in the assessment  since last visit General: Alert, cooperative, no distress, appears stated age.  Head: Normocephalic, without obvious abnormality, atraumatic. Eyes: Conjunctivae clear, anicteric sclerae. Pupils are equal Nose: Nares normal. No drainage or sinus tenderness. Throat: Lips, mucosa, and tongue normal. No Thrush Neck: Supple, symmetrical, no adenopathy, thyroid: non tender no carotid bruit and no  JVD. Back: No CVA tenderness. Lungs: Clear to auscultation bilaterally. No Wheezing or Rhonchi. No rales. Breast NAD Heart: Regular rate and rhythm, no murmur, rub or gallop. Abdomen: Soft, non-tender,not distended. Bowel sounds normal. No masses, lap scars Extremities: Extremities normal, atraumatic, no cyanosis. No edema. No clubbing Skin: No rashes or lesions. Not Jaundiced Lymph: Cervical, supraclavicular normal. Neurologic: Grossly non-focal  Health maintenance Vaccination  pneumovac- 23-9/26/19 Prevnar-13-11/30/18 HepB unknown HepA unknown TdaP not known Flu this season Herpes zoster- MRNA vaccine ( pfizer X3) Flu vaccine 2021 ______________________ labs RPR-NR on 02/2020 [facility] ab-NR on 05/14/2018 Lipid- 07/21/20 - TC 148,TGL 62, HDL 58.2, LDL 77, HIV VL<20 Cd4  952 ( 40%) April 2021 quantiferon Gold-nonreactive from 02/2020 [id number] Geno sure archive done on 05/14/2018 does not show significant mutation.  HIV antibody TSH 1.25 ( 06/13/19) ------------------------------------------------------------  Preventive  Dental- followed currently at dental works Colonoscopy- 05/22/19 Opthal cervical pap-not sexually active and has not had it in many years Mammogram-04/02/19  Impression/Recommendation ?75 year old female with history of HIV AIDS diagnosed in the 54s   ? ?HIV AIDS: Last viral load is less than 20 and CD4 is 952( 40%) She is currently on Descovy and tivicay and would like to go on the monthly injection of cabo+rilpavirine Will check with [facility] pharmacist  Leucopenia She had bariatric surgery in Feb 2020. Recent labs from March 2021 shows normal B12, folate, zinc, copper, B1, A etc Currently no evidence of any lymphoma or other bone marrow suppressing condition-  Hypertension was on metoprolol and ramipril which were stopped and changed ot lisinopril by cardiologist Dr.K History of coronary artery disease diagnosed when she had a work-up for the first lap band  surgery SPECT scan done 03/28/18 was normal    History of autoimmune hepatitis was on prednisone and azathioprine when she was in [place] but not anymore.  There is a possibility that it was drug-induced hepatitis.( AST 34, ALT 32 and bili 1.1--all normal in April 2021)    Obesity: Had lap band surgery in 2008 and it was removed in 2018 because of the port infection.  On 05/30/2018 she underwent Roux-en-Y bypass  Discussed the management with the patient. Will find out about cabenuva and call her  Follow up 6 months or earlier

## 2020-09-22 ENCOUNTER — Other Ambulatory Visit (HOSPITAL_COMMUNITY): Payer: Self-pay

## 2020-09-22 ENCOUNTER — Other Ambulatory Visit
Admission: RE | Admit: 2020-09-22 | Discharge: 2020-09-22 | Disposition: A | Discharge status: Home / Self Care | Payer: Medicare Other | Source: Ambulatory Visit | Attending: Infectious Diseases | Admitting: Infectious Diseases

## 2020-09-22 DIAGNOSIS — B20 Human immunodeficiency virus [HIV] disease: Secondary | ICD-10-CM | POA: Insufficient documentation

## 2020-09-23 ENCOUNTER — Telehealth: Payer: Self-pay | Admitting: *Deleted

## 2020-09-23 LAB — T-HELPER CELLS CD4/CD8 %
% CD 4 Pos. Lymph.: 36.7 % (ref 30.8–58.5)
Absolute CD 4 Helper: 551 /uL (ref 359–1519)
Basophils Absolute: 0 10*3/uL (ref 0.0–0.2)
Basos: 1 %
CD3+CD4+ Cells/CD3+CD8+ Cells Bld: 1.73 (ref 0.92–3.72)
CD3+CD8+ Cells # Bld: 318 /uL (ref 109–897)
CD3+CD8+ Cells NFr Bld: 21.2 % (ref 12.0–35.5)
EOS (ABSOLUTE): 0 10*3/uL (ref 0.0–0.4)
Eos: 1 %
Hematocrit: 38.3 % (ref 34.0–46.6)
Hemoglobin: 13.1 g/dL (ref 11.1–15.9)
Immature Grans (Abs): 0 10*3/uL (ref 0.0–0.1)
Immature Granulocytes: 0 %
Lymphocytes Absolute: 1.5 10*3/uL (ref 0.7–3.1)
Lymphs: 49 %
MCH: 29.7 pg (ref 26.6–33.0)
MCHC: 34.2 g/dL (ref 31.5–35.7)
MCV: 87 fL (ref 79–97)
Monocytes Absolute: 0.2 10*3/uL (ref 0.1–0.9)
Monocytes: 8 %
Neutrophils Absolute: 1.3 10*3/uL — ABNORMAL LOW (ref 1.4–7.0)
Neutrophils: 41 %
Platelets: 174 10*3/uL (ref 150–450)
RBC: 4.41 x10E6/uL (ref 3.77–5.28)
RDW: 13.2 % (ref 11.7–15.4)
WBC: 3.1 10*3/uL — ABNORMAL LOW (ref 3.4–10.8)

## 2020-09-23 LAB — HIV-1 RNA QUANT-NO REFLEX-BLD
HIV 1 RNA Quant: <20 copies/mL
LOG10 HIV-1 RNA: UNDETERMINED log10copy/mL

## 2020-09-23 NOTE — Telephone Encounter (Signed)
-----   Message from Lynn Ito, MD sent at 09/23/2020  1:50 PM EDT ----- Can you please tell patient that her labs are normal- thx ----- Message ----- From: Interface, Lab In [place] Sent: 09/23/2020  11:37 AM EDT To: Lynn Ito, MD

## 2020-09-23 NOTE — Telephone Encounter (Signed)
Called patient, call went to voicemail. RN left message that labs are good, asked her to call back and confirm. Andree Coss, RN

## 2020-09-29 ENCOUNTER — Telehealth: Payer: Self-pay

## 2020-09-29 NOTE — Telephone Encounter (Signed)
Patient returned a missed called from [place], said the massage had said labs came back good and to call back to confirm. Best contact number [phone], please call if there is anything else that needs to be discussed.

## 2020-10-03 IMAGING — MG DIGITAL SCREENING BILAT W/ TOMO W/ CAD
8 series · 8 of 24 positions shown · non-contrast
Comparison: None.

CLINICAL DATA: Screening.

EXAM:
DIGITAL SCREENING BILATERAL MAMMOGRAM WITH TOMO AND CAD

[R MLO synth-2D]
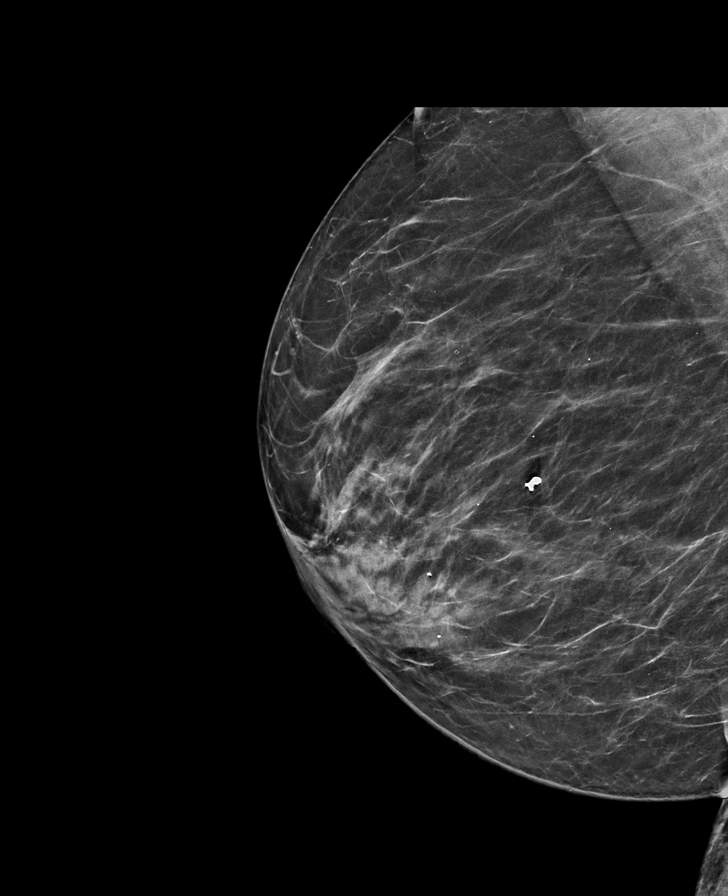

[L MLO synth-2D]
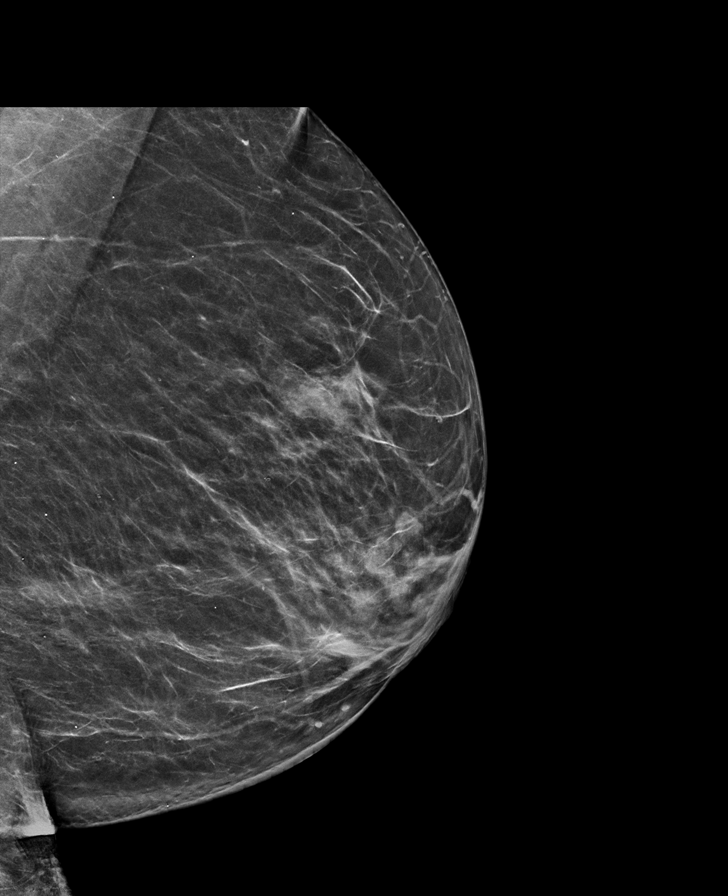

[R CC synth-2D]
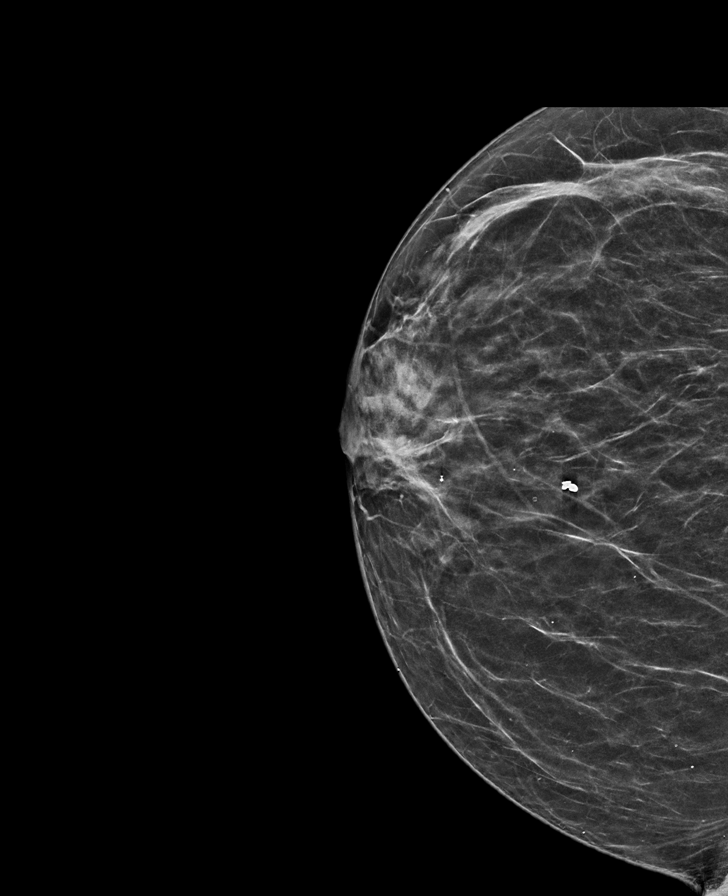

[L CC synth-2D]
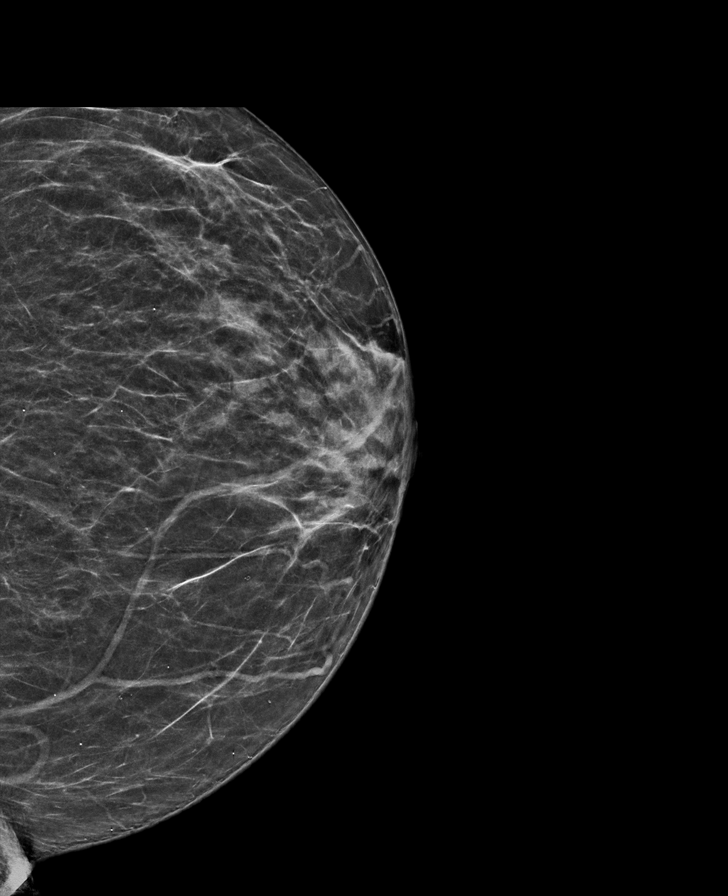

[R MLO tomo · tomo slice 35/70.0]
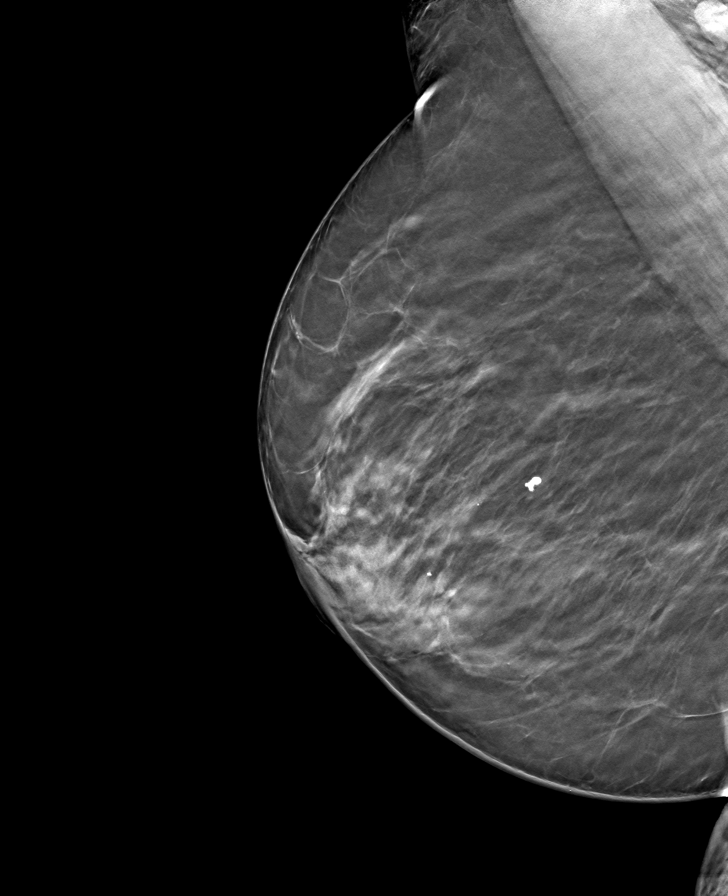

[L CC tomo · tomo slice 32/63.0]
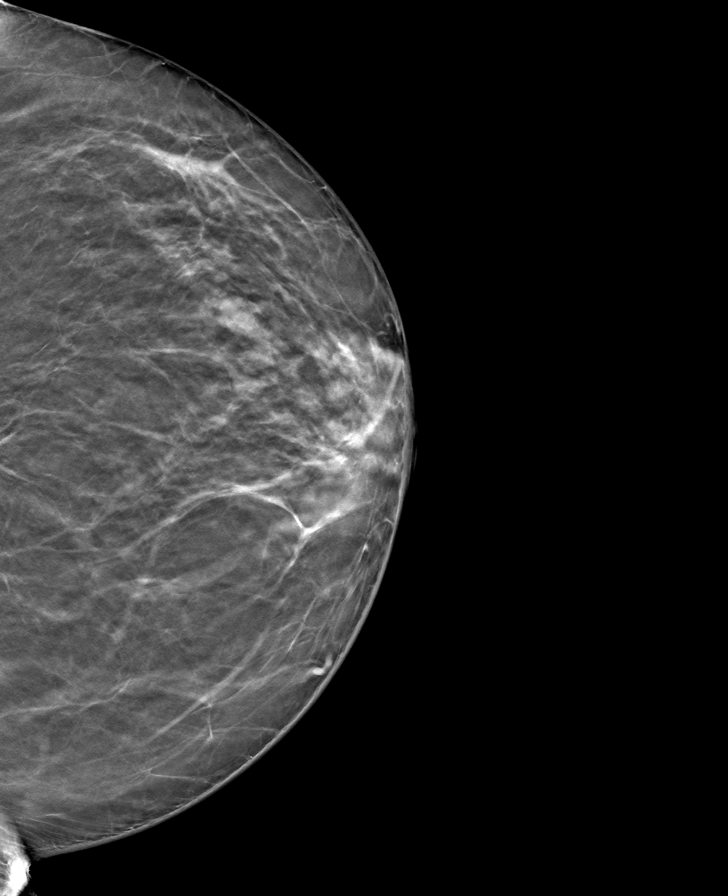

[R CC tomo · tomo slice 31/62.0]
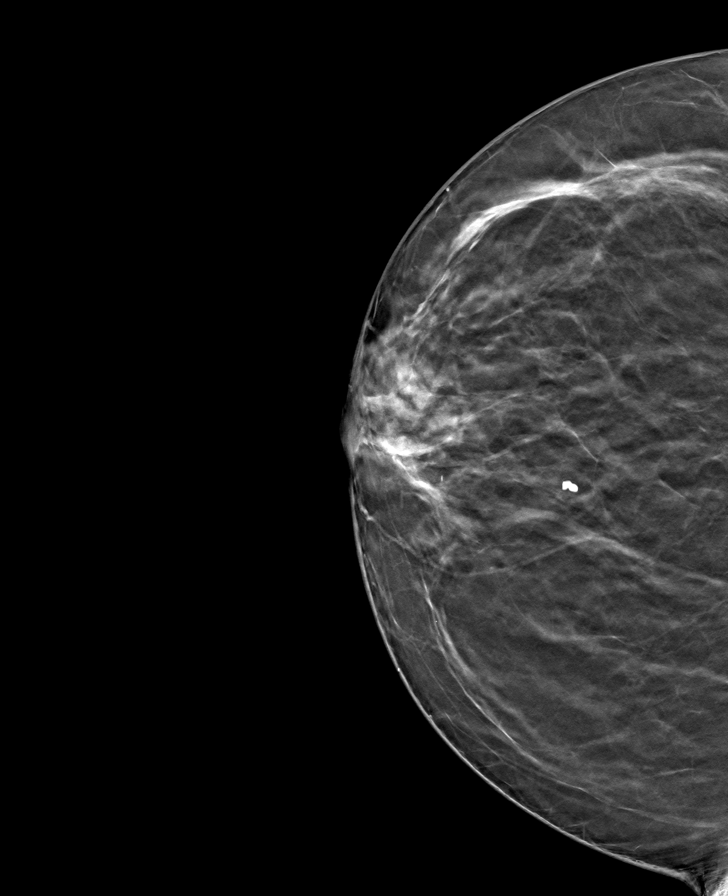

[L MLO tomo · tomo slice 36/71.0]
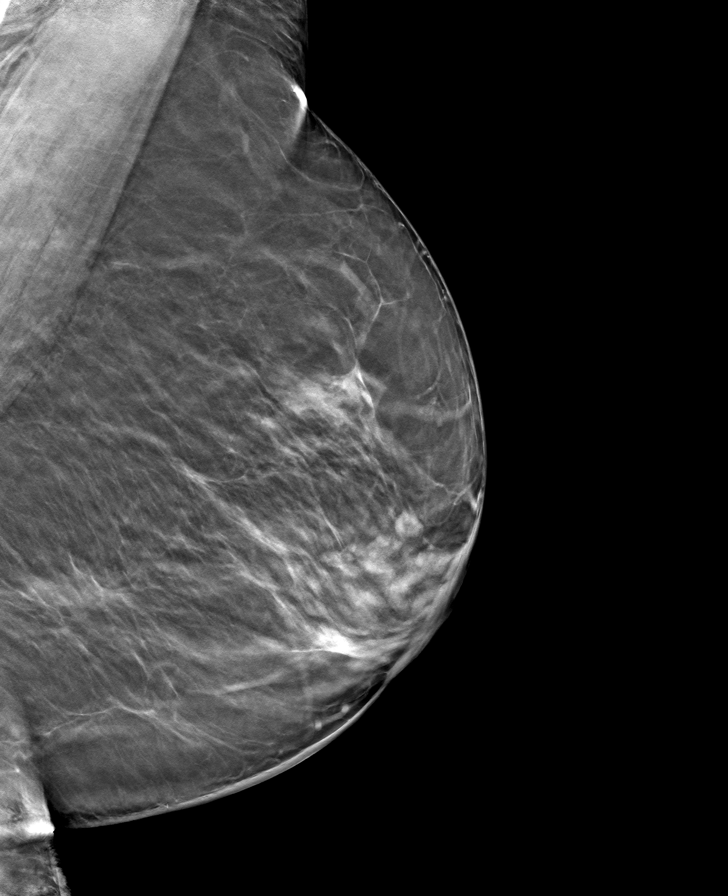

[8 of 24 positions shown; findings below may reference images not displayed]

ACR Breast Density Category b: There are scattered areas of
fibroglandular density.
FINDINGS: In the left breast, a possible asymmetry warrants further
evaluation. In the right breast, no findings suspicious for
malignancy. Images were processed with CAD.
IMPRESSION: Further evaluation is suggested for possible asymmetry in the left
breast.

RECOMMENDATION:
Diagnostic mammogram and possibly ultrasound of the left breast.
(Code:[id number]-[id number])

The patient will be contacted regarding the findings, and additional
imaging will be scheduled.

BI-RADS CATEGORY  0: Incomplete. Need additional imaging evaluation
and/or prior mammograms for comparison.

## 2020-12-01 DIAGNOSIS — B20 Human immunodeficiency virus [HIV] disease: Secondary | ICD-10-CM

## 2020-12-02 ENCOUNTER — Telehealth: Payer: Self-pay

## 2020-12-02 ENCOUNTER — Other Ambulatory Visit (HOSPITAL_COMMUNITY): Payer: Self-pay

## 2020-12-02 NOTE — Telephone Encounter (Signed)
[facility] Patient Advocate Encounter  Renaldo Harrison is covered under patient pharmacy benefits her copay is $6.00.  Prescription can be filled at [facility].  Clearance Coots , CPhT Specialty Pharmacy Patient [facility] for Infectious Disease Phone: [phone] Fax:  [phone]

## 2021-01-10 MED ORDER — DESCOVY 200-25 MG PO TABS: 1.0000 | ORAL_TABLET | Freq: Every day | ORAL | 2 refills | Status: DC

## 2021-01-10 MED ORDER — DOLUTEGRAVIR SODIUM 50 MG PO TABS: 50.0000 mg | ORAL_TABLET | Freq: Every day | ORAL | 2 refills | Status: DC

## 2021-03-22 ENCOUNTER — Ambulatory Visit: Payer: Medicare Other | Attending: Infectious Diseases | Admitting: Infectious Diseases

## 2021-03-22 ENCOUNTER — Other Ambulatory Visit: Payer: Self-pay

## 2021-03-22 DIAGNOSIS — I10 Essential (primary) hypertension: Secondary | ICD-10-CM | POA: Insufficient documentation

## 2021-03-22 DIAGNOSIS — Z9884 Bariatric surgery status: Secondary | ICD-10-CM | POA: Insufficient documentation

## 2021-03-22 DIAGNOSIS — D72819 Decreased white blood cell count, unspecified: Secondary | ICD-10-CM | POA: Diagnosis not present

## 2021-03-22 DIAGNOSIS — B20 Human immunodeficiency virus [HIV] disease: Secondary | ICD-10-CM | POA: Diagnosis not present

## 2021-03-22 DIAGNOSIS — K754 Autoimmune hepatitis: Secondary | ICD-10-CM | POA: Diagnosis not present

## 2021-03-22 DIAGNOSIS — I2510 Atherosclerotic heart disease of native coronary artery without angina pectoris: Secondary | ICD-10-CM | POA: Insufficient documentation

## 2021-03-22 MED ORDER — DOLUTEGRAVIR SODIUM 50 MG PO TABS: 50.0000 mg | ORAL_TABLET | Freq: Every day | ORAL | 2 refills | Status: DC

## 2021-03-22 MED ORDER — DESCOVY 200-25 MG PO TABS: 1.0000 | ORAL_TABLET | Freq: Every day | ORAL | 2 refills | Status: DC

## 2021-03-22 NOTE — Progress Notes (Signed)
NAME: Becky Lee  DOB: 1945-09-05  MRN: [id number]  Date/Time: 03/22/2021 10:07 AM Subjective:  Follow-up visit for HIV. Doing well since I last saw her Last seen June 2022 No sepcific complaints Saw Dr. Ginette Pitman 01/19/2021 - lipids normal, Hba1c n Got a new puppy during thanksgiving Walking the puppy   - history taken from last note Leslie Dimeo is a 75 y.o. with a history of HIV, HTN, Morbid obesity, had lap band surgery, port infection, removal of band in 2018, autoimmune hepatitis.  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 Carmelina Noun at [facility] for bariatric surgery in [place].  She is taking Descovy plus dolutegravir and is 100% adherent. No side effects No new medical issues   She was diagnosed in 1984 and she states it was secondary to a blood transfusion she had for total abdominal hysterectomy.  She remembers that her nadir CD4 was in single digits.  She was in [place] then.   Her cd4 1118 and Vl < 20 on Sep 07, 2017.. 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 Not sexually active in many years now  AIDS diagnosed 1984 Nadir Cd4 <50 OI - zoster,  HAARt history- many regimens Combivir, crixivan, atripla odefsey Current- Descovy + tivicay Acquired thru- blood transfusion for TAH in 1984  ? Past Medical History:  Diagnosis Date   HIV (human immunodeficiency virus infection) ([place])    Hypertension    morbid obesity Autoimmune hepatitis  In 2008- took steroids and azothiprine Fibroid uterus with mennorhagia 12/19/ 2019- SPECT study FINDINGS: Regional wall motion:  reveals normal myocardial thickening and wall motion.The overall quality of the study is good.   Left ventricular cavity: normal. Perfusion Analysis:   SPECT images demonstrate homogeneous tracer distribution throughout the myocardium.    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  FH: alcoholism- deceased-brother Liver /lung cancer dad CVA-mother  Allergy-NKDA ? Meds lisinopril Descovy+Tivicay Calcium citrate  MVT ( bariatric advantage multivitamin)   REVIEW OF SYSTEMS:  Const: negative fever, negative chills, positive weight gain Eyes: negative diplopia or visual changes, negative eye pain ENT: has coryza, negative sore throat Broken teeth- got a  new plate Resp: negative cough, hemoptysis, dyspnea Cards: negative for chest pain, palpitations, lower extremity edema GU: negative for frequency, dysuria and hematuria Skin: negative for rash and pruritus Heme: negative for easy bruising and gum/nose bleeding MS: negative for myalgias, arthralgias, back pain and muscle weakness Neurolo:negative for headaches, dizziness, vertigo, memory problems  Psych: negative for feelings of anxiety, depression   Objective:  VITALS:  There were no vitals taken for this visit.  PHYSICAL EXAM: No change in the assessment  since last visit General: Alert, cooperative, no distress, appears stated age.  Head: Normocephalic, without obvious abnormality, atraumatic. Eyes: Conjunctivae clear, anicteric sclerae. Pupils are equal Nose: Nares normal. No drainage or sinus tenderness. Throat: Lips, mucosa, and tongue normal. No Thrush Neck: Supple, symmetrical,  no adenopathy, thyroid: non tender no carotid bruit and no JVD. Back: No CVA tenderness. Lungs: Clear to auscultation bilaterally. No Wheezing or Rhonchi. No rales. Breast  NAD Heart: Regular rate and rhythm, no murmur, rub or gallop. Abdomen: Soft, non-tender,not distended. Bowel sounds normal. No masses, lap scars Extremities: Extremities normal, atraumatic, no cyanosis. No edema. No clubbing Skin: No rashes or lesions. Not Jaundiced Lymph: Cervical, supraclavicular normal. Neurologic: Grossly non-focal  Health maintenance Vaccination  pneumovac- 23-9/26/19 Prevnar-13-11/30/18 HepB unknown HepA unknown TdaP not known Flu this season Herpes zoster- MRNA vaccine ( pfizer X3) Flu vaccine 2021 ______________________ labs RPR-NR on 02/2020 [facility] ab-NR on 05/14/2018 Lipid- 07/21/20 - TC 148,TGL 62, HDL 58.2, LDL 77, HIV VL<20 Cd4  952 ( 40%) April 2021 quantiferon Gold-nonreactive from 02/2020 [id number] Geno sure archive done on 05/14/2018 does not show significant mutation.  HIV antibody TSH 1.25 ( 06/13/19) ------------------------------------------------------------  Preventive  Dental- followed currently at dental works Colonoscopy- 05/22/19 Opthal cervical pap-not sexually active and has not had it in many years Mammogram-04/02/19  Impression/Recommendation ?75 year old female with history of HIV AIDS diagnosed in the 66s   ? ?HIV AIDS: Last viral load is less than 20 and CD4 is 952( 40%) She is currently on Descovy and tivicay and would like to go on the monthly injection of cabo+rilpavirine Need to explore the feasibility of giving her intramuscular injection once every 2 months in my clinic.  Leucopenia  She had bariatric surgery in Feb 2020. Recent labs from March 2021 shows normal B12, folate, zinc, copper, B1, A etc Currently no evidence of any lymphoma or other bone marrow suppressing condition-  Hypertension lisinopril by cardiologist Dr.K  History of coronary artery disease diagnosed when she had a work-up for the first lap band surgery SPECT scan done 03/28/18 was normal    History of autoimmune hepatitis was on prednisone and  azathioprine when she was in [place] but not anymore.  There is a possibility that it was drug-induced hepatitis.( AST 34, ALT 32 and bili 1.1--all normal in April 2021)    Obesity: Had lap band surgery in 2008 and it was removed in 2018 because of the port infection.  On 05/30/2018 she underwent Roux-en-Y bypass  Discussed the management with the patient.  Follow up 6 months or earlier

## 2021-03-22 NOTE — Patient Instructions (Signed)
You are here for follow up- today we will not do any labs- will do it with next visit in 6 months.continue Descovy/tivicay.

## 2021-04-19 ENCOUNTER — Other Ambulatory Visit (HOSPITAL_COMMUNITY): Payer: Self-pay

## 2021-04-25 ENCOUNTER — Other Ambulatory Visit: Payer: Self-pay | Admitting: Infectious Diseases

## 2021-04-25 ENCOUNTER — Telehealth: Payer: Self-pay

## 2021-04-25 ENCOUNTER — Other Ambulatory Visit (HOSPITAL_COMMUNITY): Payer: Self-pay

## 2021-04-25 DIAGNOSIS — B20 Human immunodeficiency virus [HIV] disease: Secondary | ICD-10-CM

## 2021-04-25 MED ORDER — CABOTEGRAVIR & RILPIVIRINE ER 600 & 900 MG/3ML IM SUER: 1.0000 | Freq: Once | INTRAMUSCULAR | Status: DC

## 2021-04-25 NOTE — Telephone Encounter (Signed)
Patient approved for Cabenuva Inj. per Clearance Coots. Will have $6 copay. Rx needs to be sent to [facility] and Weston Brass will alert Asher Muir for the delivery options once it arrives. Asher Muir will contact patient to schedule and get payment info for [facility] copay once Rx is ready at [facility]. Needs to be scheduled before 05/03/21 to remain on track for dose 2 since we are closed beginning 06/06/22. Provider aware and will send RX to [facility] pharmacy.

## 2021-04-25 NOTE — Progress Notes (Signed)
Will talk to patient first to see when she is able to start Cabenuva monthly injections ( after 2 doses can go to once in 2 months) . Her insurance has approved it. The starting dose will be 600mg /948m IM

## 2021-04-26 ENCOUNTER — Other Ambulatory Visit: Payer: Self-pay | Admitting: Pharmacist

## 2021-04-26 ENCOUNTER — Other Ambulatory Visit (HOSPITAL_COMMUNITY): Payer: Self-pay

## 2021-04-26 ENCOUNTER — Other Ambulatory Visit: Payer: Self-pay | Admitting: Infectious Diseases

## 2021-04-26 DIAGNOSIS — B20 Human immunodeficiency virus [HIV] disease: Secondary | ICD-10-CM

## 2021-04-26 MED ORDER — CABOTEGRAVIR & RILPIVIRINE ER 600 & 900 MG/3ML IM SUER
1.0000 | INTRAMUSCULAR | 5 refills | Status: DC
  Filled 2021-04-26 – 2021-07-25 (×2): qty 6, 60d supply, fill #0
  Filled 2021-09-19: qty 6, 60d supply, fill #1
  Filled 2021-11-14: qty 6, 60d supply, fill #2
  Filled 2022-01-09: qty 6, 60d supply, fill #3
  Filled 2022-03-21: qty 6, 60d supply, fill #4

## 2021-04-26 MED ORDER — CABOTEGRAVIR & RILPIVIRINE ER 600 & 900 MG/3ML IM SUER
1.0000 | INTRAMUSCULAR | 1 refills | Status: DC
  Filled 2021-04-26 (×2): qty 6, 30d supply, fill #0
  Filled 2021-05-24: qty 6, 30d supply, fill #1

## 2021-04-26 MED ORDER — CABENUVA 600 & 900 MG/3ML IM SUER
1.0000 | INTRAMUSCULAR | 1 refills | Status: DC
  Filled 2021-04-26: qty 6, 30d supply, fill #0

## 2021-04-26 MED ORDER — CABENUVA 600 & 900 MG/3ML IM SUER
1.0000 | Freq: Once | INTRAMUSCULAR | 1 refills | Status: DC
Start: 2021-04-26 — End: 2021-04-26
  Filled 2021-04-26: qty 6, 1d supply, fill #0

## 2021-04-27 ENCOUNTER — Other Ambulatory Visit (HOSPITAL_COMMUNITY): Payer: Self-pay

## 2021-04-27 ENCOUNTER — Telehealth: Payer: Self-pay

## 2021-04-27 NOTE — Telephone Encounter (Signed)
[facility] Patient Advocate Encounter  Patient's medication Renaldo Harrison) have been couriered to [facility] from [facility] and will be administered on patient nest office visit on 01/19/2 at [facility].  Asher Muir will pick up medication on 04/27/21 and take over to [facility] .  Clearance Coots , CPhT Specialty Pharmacy Patient [facility] for Infectious Disease Phone: [phone] Fax:  [phone]

## 2021-04-28 ENCOUNTER — Ambulatory Visit: Payer: Medicare HMO | Attending: Infectious Diseases | Admitting: Infectious Diseases

## 2021-04-28 ENCOUNTER — Other Ambulatory Visit: Payer: Self-pay

## 2021-04-28 ENCOUNTER — Other Ambulatory Visit
Admission: RE | Admit: 2021-04-28 | Discharge: 2021-04-28 | Disposition: A | Discharge status: Home / Self Care | Payer: Medicare HMO | Source: Ambulatory Visit | Attending: Infectious Diseases | Admitting: Infectious Diseases

## 2021-04-28 VITALS — BP 104/86 | HR 90 | Resp 16 | Ht 65.0 in | Wt 219.0 lb

## 2021-04-28 DIAGNOSIS — Z79899 Other long term (current) drug therapy: Secondary | ICD-10-CM | POA: Diagnosis not present

## 2021-04-28 DIAGNOSIS — Z6836 Body mass index (BMI) 36.0-36.9, adult: Secondary | ICD-10-CM | POA: Diagnosis not present

## 2021-04-28 DIAGNOSIS — B20 Human immunodeficiency virus [HIV] disease: Secondary | ICD-10-CM | POA: Insufficient documentation

## 2021-04-28 DIAGNOSIS — D72819 Decreased white blood cell count, unspecified: Secondary | ICD-10-CM | POA: Diagnosis not present

## 2021-04-28 DIAGNOSIS — Z9884 Bariatric surgery status: Secondary | ICD-10-CM | POA: Insufficient documentation

## 2021-04-28 DIAGNOSIS — Z90710 Acquired absence of both cervix and uterus: Secondary | ICD-10-CM | POA: Diagnosis not present

## 2021-04-28 DIAGNOSIS — E669 Obesity, unspecified: Secondary | ICD-10-CM | POA: Diagnosis not present

## 2021-04-28 DIAGNOSIS — K754 Autoimmune hepatitis: Secondary | ICD-10-CM | POA: Diagnosis not present

## 2021-04-28 DIAGNOSIS — I10 Essential (primary) hypertension: Secondary | ICD-10-CM | POA: Insufficient documentation

## 2021-04-28 DIAGNOSIS — I2510 Atherosclerotic heart disease of native coronary artery without angina pectoris: Secondary | ICD-10-CM | POA: Insufficient documentation

## 2021-04-28 MED ORDER — CABOTEGRAVIR & RILPIVIRINE ER 600 & 900 MG/3ML IM SUER
1.0000 | Freq: Once | INTRAMUSCULAR | Status: AC
  Administered 2021-04-28: 1 via INTRAMUSCULAR

## 2021-04-28 NOTE — Patient Instructions (Signed)
You are to get your first injection of cabenua injection- will do labs today- will follow up 1 month- let us know how you feel next week

## 2021-04-28 NOTE — Addendum Note (Signed)
Addended by: Clayborne Artist A on: 04/28/2021 01:14 PM   Modules accepted: Orders

## 2021-04-28 NOTE — Progress Notes (Signed)
NAME: Becky Lee  DOB: November 27, 1945  MRN: [id number]  Date/Time: 04/28/2021 12:41 PM Subjective:  Follow-up visit for HIV. Doing well since I last saw her Last seen Dec 2022 Is here today to switch to IM HAART- IM cabenuva Currently on descovy and Tivicay- will stop once she gets IM Had taken rilpavarine before ( when she took [facility] Dr. Ginette Pitman 01/19/2021 - lipids normal, Hba1c n  Yared Herpin is a 76 y.o. with a history of HIV, HTN, Morbid obesity, had lap band surgery, port infection, removal of band in 2018, autoimmune hepatitis.  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 Carmelina Noun at [facility] for bariatric surgery in [place].   She was diagnosed in 1984 and she states it was secondary to a blood transfusion she had for total abdominal hysterectomy.  She remembers that her nadir CD4 was in single digits.  She was in [place] then.   Her cd4 1118 and Vl < 20 on Sep 07, 2017.. 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 Not sexually active in many years now  AIDS diagnosed 1984 Nadir Cd4 <50 OI - zoster,  HAARt history- many regimens Combivir, crixivan, atripla odefsey Current- Descovy + tivicay Acquired thru- blood transfusion for TAH in 1984  ? Past Medical History:  Diagnosis Date   HIV (human immunodeficiency virus infection) ([place])    Hypertension    morbid obesity Autoimmune hepatitis  In 2008- took steroids and azothiprine Fibroid uterus with mennorhagia 12/19/ 2019- SPECT study FINDINGS: Regional wall motion:  reveals normal myocardial thickening and wall motion.The overall quality of the study is good.   Left ventricular cavity: normal. Perfusion Analysis:  SPECT images demonstrate homogeneous tracer  distribution throughout the myocardium.    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  FH: alcoholism- deceased-brother Liver /lung cancer dad CVA-mother  Allergy-NKDA ? Meds lisinopril Calcium citrate  MVT ( bariatric advantage multivitamin)   REVIEW OF SYSTEMS:  Const: negative fever, negative chills, positive weight gain Eyes: negative diplopia or visual changes, negative eye pain ENT: has coryza, negative sore throat Broken teeth- got a  new plate Resp: negative cough, hemoptysis, dyspnea Cards: negative for chest pain, palpitations, lower extremity edema GU: negative for frequency, dysuria and hematuria Skin: negative for rash and pruritus Heme: negative for easy bruising and gum/nose bleeding MS: negative for myalgias, arthralgias, back pain and muscle weakness Neurolo:negative for headaches, dizziness, vertigo, memory problems  Psych: negative for feelings of anxiety, depression   Objective:  VITALS:  BP 104/86    Pulse 90    Resp 16    Ht 5\' 5"  (1.651 m)    Wt 219 lb (99.3 kg)    SpO2 94%    BMI 36.44 kg/m   PHYSICAL EXAM: No change in the assessment  since last visit General: Alert, cooperative, no distress, appears stated age.  Head: Normocephalic, without obvious abnormality, atraumatic. Eyes: Conjunctivae clear,  anicteric sclerae. Pupils are equal Nose: Nares normal. No drainage or sinus tenderness. Throat: Lips, mucosa, and tongue normal. No Thrush Neck: Supple, symmetrical, no adenopathy, thyroid: non tender no carotid bruit and no JVD. Back: No CVA tenderness. Lungs: Clear to auscultation bilaterally. No Wheezing or Rhonchi. No  rales. Breast NAD Heart: Regular rate and rhythm, no murmur, rub or gallop. Abdomen: Soft, non-tender,not distended. Bowel sounds normal. No masses, lap scars Extremities: Extremities normal, atraumatic, no cyanosis. No edema. No clubbing Skin: No rashes or lesions. Not Jaundiced Lymph: Cervical, supraclavicular normal. Neurologic: Grossly non-focal  Health maintenance Vaccination  pneumovac- 23-9/26/19 Prevnar-13-11/30/18 HepB unknown HepA unknown TdaP not known Flu this season Herpes zoster- MRNA vaccine ( pfizer X3) Flu vaccine 2021 ______________________ labs RPR-NR on 02/2020 [facility] ab-NR on 05/14/2018 Lipid- 07/21/20 - TC 148,TGL 62, HDL 58.2, LDL 77, HIV VL<20 Cd4  952 ( 40%) April 2021 quantiferon Gold-nonreactive from 02/2020 [id number] Geno sure archive done on 05/14/2018 does not show significant mutation.  HIV antibody TSH 1.25 ( 06/13/19) ------------------------------------------------------------  Preventive  Dental- followed currently at dental works Colonoscopy- 05/22/19 Opthal cervical pap-not sexually active and has not had it in many years Mammogram-04/02/19  Impression/Recommendation ?76 year old female with history of HIV AIDS diagnosed in the 58s   ? ?HIV AIDS: Last viral load is less than 20 and CD4 is 551( 37%) She is currently on Descovy and tivicay and would like to go on the monthly injection of cabo+rilpavirine Today she will get the first dose ( 600mg /(900mg  Im injections) Follow up 1 month for 2 nd dose and then every 2 months  Leucopenia  She had bariatric surgery in Feb 2020. Recent labs from March 2021 shows normal B12, folate, zinc, copper, B1, A etc Currently no evidence of any lymphoma or other bone marrow suppressing condition-  Hypertension lisinopril by cardiologist Dr.K  History of coronary artery disease diagnosed when she had a work-up for the first lap band surgery SPECT scan done 03/28/18 was normal    History of autoimmune  hepatitis was on prednisone and azathioprine when she was in [place] but not anymore.  There is a possibility that it was drug-induced hepatitis.( AST 34, ALT 32 and bili 1.1--all normal in April 2021)    Obesity: Had lap band surgery in 2008 and it was removed in 2018 because of the port infection.  On 05/30/2018 she underwent Roux-en-Y bypass  Discussed the management with the patient.  Follow up 1 month Explained the side effects of cabenuva She will let us know how she is doing

## 2021-04-29 LAB — T-HELPER CELLS CD4/CD8 %
% CD 4 Pos. Lymph.: 36.3 % (ref 30.8–58.5)
Absolute CD 4 Helper: 617 /uL (ref 359–1519)
Basophils Absolute: 0 10*3/uL (ref 0.0–0.2)
Basos: 1 %
CD3+CD4+ Cells/CD3+CD8+ Cells Bld: 1.59 (ref 0.92–3.72)
CD3+CD8+ Cells # Bld: 389 /uL (ref 109–897)
CD3+CD8+ Cells NFr Bld: 22.9 % (ref 12.0–35.5)
EOS (ABSOLUTE): 0 10*3/uL (ref 0.0–0.4)
Eos: 1 %
Hematocrit: 39.7 % (ref 34.0–46.6)
Hemoglobin: 13.6 g/dL (ref 11.1–15.9)
Immature Grans (Abs): 0 10*3/uL (ref 0.0–0.1)
Immature Granulocytes: 0 %
Lymphocytes Absolute: 1.7 10*3/uL (ref 0.7–3.1)
Lymphs: 39 %
MCH: 29.7 pg (ref 26.6–33.0)
MCHC: 34.3 g/dL (ref 31.5–35.7)
MCV: 87 fL (ref 79–97)
Monocytes Absolute: 0.3 10*3/uL (ref 0.1–0.9)
Monocytes: 8 %
Neutrophils Absolute: 2.3 10*3/uL (ref 1.4–7.0)
Neutrophils: 51 %
Platelets: 214 10*3/uL (ref 150–450)
RBC: 4.58 x10E6/uL (ref 3.77–5.28)
RDW: 13.1 % (ref 11.7–15.4)
WBC: 4.3 10*3/uL (ref 3.4–10.8)

## 2021-04-29 LAB — HIV-1 RNA QUANT-NO REFLEX-BLD
HIV 1 RNA Quant: <20 copies/mL
LOG10 HIV-1 RNA: UNDETERMINED log10copy/mL

## 2021-04-29 LAB — RPR: RPR Ser Ql: NONREACTIVE

## 2021-05-01 LAB — QUANTIFERON-TB GOLD PLUS: QuantiFERON-TB Gold Plus: NEGATIVE

## 2021-05-01 LAB — QUANTIFERON-TB GOLD PLUS (RQFGPL)
QuantiFERON Mitogen Value: >10 IU/mL
QuantiFERON Nil Value: 0.06 IU/mL
QuantiFERON TB1 Ag Value: 0.04 IU/mL
QuantiFERON TB2 Ag Value: 0.04 IU/mL

## 2021-05-03 ENCOUNTER — Telehealth: Payer: Self-pay

## 2021-05-03 NOTE — Telephone Encounter (Signed)
Called to see how patient feels after having had injection of Cabenuva last week. She feels great and has had no reaction other than small lump at injection site which we discussed she may get.

## 2021-05-24 ENCOUNTER — Other Ambulatory Visit (HOSPITAL_COMMUNITY): Payer: Self-pay

## 2021-05-25 ENCOUNTER — Other Ambulatory Visit (HOSPITAL_COMMUNITY): Payer: Self-pay

## 2021-05-26 ENCOUNTER — Other Ambulatory Visit: Payer: Self-pay

## 2021-05-26 ENCOUNTER — Other Ambulatory Visit (HOSPITAL_COMMUNITY): Payer: Self-pay

## 2021-06-02 ENCOUNTER — Encounter: Payer: Self-pay | Admitting: Infectious Diseases

## 2021-06-02 ENCOUNTER — Other Ambulatory Visit: Payer: Self-pay

## 2021-06-02 ENCOUNTER — Telehealth: Payer: Self-pay

## 2021-06-02 ENCOUNTER — Ambulatory Visit: Payer: Medicare HMO | Attending: Infectious Diseases | Admitting: Infectious Diseases

## 2021-06-02 VITALS — BP 157/87 | HR 46 | Temp 97.6°F | Wt 217.0 lb

## 2021-06-02 DIAGNOSIS — I2510 Atherosclerotic heart disease of native coronary artery without angina pectoris: Secondary | ICD-10-CM | POA: Insufficient documentation

## 2021-06-02 DIAGNOSIS — Z9884 Bariatric surgery status: Secondary | ICD-10-CM | POA: Insufficient documentation

## 2021-06-02 DIAGNOSIS — I10 Essential (primary) hypertension: Secondary | ICD-10-CM | POA: Insufficient documentation

## 2021-06-02 DIAGNOSIS — Z79899 Other long term (current) drug therapy: Secondary | ICD-10-CM | POA: Insufficient documentation

## 2021-06-02 DIAGNOSIS — D72819 Decreased white blood cell count, unspecified: Secondary | ICD-10-CM | POA: Insufficient documentation

## 2021-06-02 DIAGNOSIS — E669 Obesity, unspecified: Secondary | ICD-10-CM

## 2021-06-02 DIAGNOSIS — Z6836 Body mass index (BMI) 36.0-36.9, adult: Secondary | ICD-10-CM | POA: Insufficient documentation

## 2021-06-02 DIAGNOSIS — B20 Human immunodeficiency virus [HIV] disease: Secondary | ICD-10-CM | POA: Insufficient documentation

## 2021-06-02 DIAGNOSIS — Z90710 Acquired absence of both cervix and uterus: Secondary | ICD-10-CM | POA: Insufficient documentation

## 2021-06-02 DIAGNOSIS — Z8619 Personal history of other infectious and parasitic diseases: Secondary | ICD-10-CM | POA: Diagnosis not present

## 2021-06-02 MED ORDER — CABOTEGRAVIR & RILPIVIRINE ER 600 & 900 MG/3ML IM SUER
1.0000 | Freq: Once | INTRAMUSCULAR | Status: AC
  Administered 2021-06-02: 1 via INTRAMUSCULAR

## 2021-06-02 NOTE — Progress Notes (Signed)
NAME: Becky Lee  DOB: 09-21-1945  MRN: [id number]  Date/Time: 06/02/2021 12:06 PM Subjective:  Follow up after starting cabenuva injection for HIV She received cabenuva ( cabotegravir and rilpavirine on 04/28/21) no side effetcs- like headache, nausea , vomiting, dry mouth, diarrhea. No pain or swelling at the injection site She will get her 2nd dose today  Followin gtaken from an old note Becky Lee is a 76 y.o. with a history of HIV, HTN, Morbid obesity, had lap band surgery, port infection, removal of band in 2018, autoimmune hepatitis.  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 Carmelina Noun at [facility] for bariatric surgery in [place].   She was diagnosed in 1984 and she states it was secondary to a blood transfusion she had for total abdominal hysterectomy.  She remembers that her nadir CD4 was in single digits.  She was in [place] then.   Her cd4 1118 and Vl < 20 on Sep 07, 2017.. 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 Not sexually active in many years now  AIDS diagnosed 1984 Nadir Cd4 <50 OI - zoster,  HAARt history- many regimens Combivir, crixivan, atripla odefsey Descovy + tivicay- DC 04/28/21 Acquired thru- blood transfusion for TAH in 1984  ? Past Medical History:  Diagnosis Date   HIV (human immunodeficiency virus infection) ([place])    Hypertension    morbid obesity Autoimmune hepatitis  In 2008- took steroids and azothiprine Fibroid uterus with mennorhagia 12/19/ 2019- SPECT study FINDINGS: Regional wall motion:  reveals normal myocardial thickening and wall motion.The overall quality of the study is good.   Left ventricular cavity:  normal. Perfusion Analysis:  SPECT images demonstrate homogeneous tracer distribution throughout the myocardium.    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  FH: alcoholism- deceased-brother Liver /lung cancer dad CVA-mother  Allergy-NKDA ? Meds lisinopril Calcium citrate  MVT ( bariatric advantage multivitamin)   REVIEW OF SYSTEMS:  Const: negative fever, negative chills, positive weight gain Eyes: negative diplopia or visual changes, negative eye pain ENT: has coryza, negative sore throat Broken teeth- got a  new plate Resp: negative cough, hemoptysis, dyspnea Cards: negative for chest pain, palpitations, lower extremity edema GU: negative for frequency, dysuria and hematuria Skin: negative for rash and pruritus Heme: negative for easy bruising and gum/nose bleeding MS: negative for myalgias, arthralgias, back pain and muscle weakness Neurolo:negative for headaches, dizziness, vertigo, memory problems  Psych: negative for feelings of anxiety, depression   Objective:  VITALS:  BP (!) 157/87    Pulse (!) 46    Temp 97.6 F (36.4 C) (Temporal)    Wt 217 lb (98.4 kg)    BMI 36.11 kg/m   PHYSICAL EXAM: No change in the assessment  since last visit General: Alert, cooperative, no distress, appears stated age.  Head: Normocephalic, without obvious abnormality, atraumatic. Eyes: Conjunctivae clear, anicteric  sclerae. Pupils are equal Nose: Nares normal. No drainage or sinus tenderness. Throat: Lips, mucosa, and tongue normal. No Thrush Neck: Supple, symmetrical, no adenopathy, thyroid: non tender no carotid bruit and no JVD. Back: No CVA  tenderness. Lungs: Clear to auscultation bilaterally. No Wheezing or Rhonchi. No rales. Breast NAD Heart: Regular rate and rhythm, no murmur, rub or gallop. Abdomen: Soft, non-tender,not distended. Bowel sounds normal. No masses, lap scars Extremities: Extremities normal, atraumatic, no cyanosis. No edema. No clubbing Skin: No rashes or lesions. Not Jaundiced Lymph: Cervical, supraclavicular normal. Neurologic: Grossly non-focal  Health maintenance Vaccination  pneumovac- 23-9/26/19 Prevnar-13-11/30/18 HepB unknown HepA unknown TdaP not known Flu this season Herpes zoster- MRNA vaccine ( pfizer X3) Flu vaccine 2021 ______________________ labs RPR-NR on 02/2020 [facility]PC ab-NR on 05/14/2018 Lipid- 07/21/20 - TC 148,TGL 62, HDL 58.2, LDL 77, HIV VL<20 Cd4  952 ( 40%) April 2021 quantiferon Gold-nonreactive from 02/2020 [id number] Geno sure archive done on 05/14/2018 does not show significant mutation.  HIV antibody TSH 1.25 ( 06/13/19) ------------------------------------------------------------  Preventive  Dental- followed currently at dental works Colonoscopy- 05/22/19 Opthal cervical pap-not sexually active and has not had it in many years Mammogram-04/02/19  Impression/Recommendation ?76 year old female with history of HIV AIDS diagnosed in the 21s   ? ?HIV AIDS: Last viral load is less than 20 and CD4 is 551( 37) She is on  cabo+rilpavirine im injection . She got her 1 st dose  04/28/21- no sid effects from the medicine Today is her 2nd dose  ( 600mg /(900mg  Im injections) Follow up  2 months for ongoing injections every 2 months after  Leucopenia- chronic and stable  She had bariatric surgery in Feb 2020. Recent labs from March 2021 shows normal B12, folate, zinc, copper, B1, A etc Currently no evidence of any lymphoma or other bone marrow suppressing condition-  Hypertension lisinopril by cardiologist Dr.K  History of coronary artery disease diagnosed when she had a  work-up for the first lap band surgery SPECT scan done 03/28/18 was normal    History of autoimmune hepatitis was on prednisone and azathioprine when she was in [place] but not anymore.  There is a possibility that it was drug-induced hepatitis.( AST 34, ALT 32 and bili 1.1--all normal in April 2021)    Obesity: Had lap band surgery in 2008 and it was removed in 2018 because of the port infection.  On 05/30/2018 she underwent Roux-en-Y bypass  Discussed the management with the patient in great detail  Follow up 71months Will do labs then

## 2021-06-02 NOTE — Telephone Encounter (Signed)
Patient's medication Renaldo Harrison) have been couriered to [facility] from [facility] pharmacy and will be administered on patient next office visit on 06/02/21. Stephane Junkins Jonathon Resides, CMA

## 2021-06-02 NOTE — Patient Instructions (Addendum)
You are here for 2nd dose of cabenuva- you have no side effects-your next dos e will be in 2 months

## 2021-07-14 ENCOUNTER — Other Ambulatory Visit (HOSPITAL_COMMUNITY): Payer: Self-pay

## 2021-07-15 ENCOUNTER — Other Ambulatory Visit (HOSPITAL_COMMUNITY): Payer: Self-pay

## 2021-07-25 ENCOUNTER — Other Ambulatory Visit (HOSPITAL_COMMUNITY): Payer: Self-pay

## 2021-07-26 ENCOUNTER — Other Ambulatory Visit: Payer: Self-pay

## 2021-08-01 ENCOUNTER — Other Ambulatory Visit: Payer: Self-pay | Admitting: Internal Medicine

## 2021-08-01 DIAGNOSIS — Z1231 Encounter for screening mammogram for malignant neoplasm of breast: Secondary | ICD-10-CM

## 2021-08-02 ENCOUNTER — Encounter: Payer: Self-pay | Admitting: Infectious Diseases

## 2021-08-02 ENCOUNTER — Other Ambulatory Visit
Admission: RE | Admit: 2021-08-02 | Discharge: 2021-08-02 | Disposition: A | Discharge status: Home / Self Care | Payer: Medicare HMO | Source: Ambulatory Visit | Attending: Infectious Diseases | Admitting: Infectious Diseases

## 2021-08-02 ENCOUNTER — Ambulatory Visit: Payer: Medicare HMO | Attending: Infectious Diseases | Admitting: Infectious Diseases

## 2021-08-02 VITALS — BP 137/87 | HR 62 | Temp 97.4°F | Ht 65.0 in | Wt 213.0 lb

## 2021-08-02 DIAGNOSIS — E876 Hypokalemia: Secondary | ICD-10-CM | POA: Insufficient documentation

## 2021-08-02 DIAGNOSIS — B20 Human immunodeficiency virus [HIV] disease: Secondary | ICD-10-CM

## 2021-08-02 DIAGNOSIS — R7989 Other specified abnormal findings of blood chemistry: Secondary | ICD-10-CM | POA: Diagnosis present

## 2021-08-02 DIAGNOSIS — Z6835 Body mass index (BMI) 35.0-35.9, adult: Secondary | ICD-10-CM | POA: Diagnosis not present

## 2021-08-02 DIAGNOSIS — D72819 Decreased white blood cell count, unspecified: Secondary | ICD-10-CM | POA: Diagnosis not present

## 2021-08-02 DIAGNOSIS — I10 Essential (primary) hypertension: Secondary | ICD-10-CM | POA: Diagnosis not present

## 2021-08-02 DIAGNOSIS — R319 Hematuria, unspecified: Secondary | ICD-10-CM | POA: Diagnosis not present

## 2021-08-02 DIAGNOSIS — Z9884 Bariatric surgery status: Secondary | ICD-10-CM | POA: Diagnosis not present

## 2021-08-02 LAB — HEPATITIS B SURFACE ANTIGEN: Hepatitis B Surface Ag: NONREACTIVE

## 2021-08-02 LAB — HEPATITIS B CORE ANTIBODY, TOTAL: Hep B Core Total Ab: NONREACTIVE

## 2021-08-02 LAB — HEPATITIS C ANTIBODY: HCV Ab: NONREACTIVE

## 2021-08-02 MED ORDER — CABOTEGRAVIR & RILPIVIRINE ER 600 & 900 MG/3ML IM SUER
1.0000 | Freq: Once | INTRAMUSCULAR | Status: AC
  Administered 2021-08-02: 1 via INTRAMUSCULAR

## 2021-08-02 NOTE — Progress Notes (Signed)
NAME: Becky Lee  ?DOB: [birth year]  ?MRN: [id number]  ?Date/Time: 08/02/2021 12:01 PM ?Subjective:  ? ?Pt is here for follow up for HIV and to receive Cabenua injection ?She has been doing very well with injection every 2 months ?No side effects ?She says  this is the first time she has never thought about Hiv every day. She is so relieved ?  ?She had some weakness and her PCP checked labs- K 3.4 and mildly elevated lfts ?On K supplement  ? ?Following taken from notes before ?Becky Lee is a 76 y.o. female with a history of HIV, HTN, Morbid obesity, had lap band surgery, port infection, removal of band in 2018, autoimmune hepatitis.  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 Barkley Boards at [facility] for bariatric surgery in [place]. ? ? She was diagnosed in 1984 and she states it was secondary to a blood transfusion she had for total abdominal hysterectomy.  She remembers that her nadir CD4 was in single digits.  She was in [place] then.   ?Her cd4 1118 and Vl < 20 on Sep 07, 2017.. In February 2020 before her HAART regimen  was changed from the regimen of Odefsey to dolutegravir plus Descovy , which was discontinued on 04/28/21, and cabenuva IM injections were started ? 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 ?Not sexually active in many years now ? ?AIDS diagnosed 1984 ?Nadir Cd4 <50 ?OI - zoster,  ?HAARt history- many regimens ?Combivir, crixivan, atripla ?odefsey ?Descovy + tivicay- DC 04/28/21 ?Acquired thru- blood transfusion for TAH in 1984 ? ?? ?Past Medical History:  ?Diagnosis Date  ? HIV (human immunodeficiency virus infection) ([facility])   ? Hypertension   ? morbid obesity ?Autoimmune hepatitis  In 2008- took steroids and azothiprine ?Fibroid uterus with mennorhagia ?12/19/ 2019- SPECT study ?FINDINGS: ?Regional wall motion:  reveals normal myocardial thickening  and wall motion.The overall quality of the study is good.   ?Left ventricular cavity: normal. ?Perfusion Analysis:  SPECT images demonstrate homogeneous tracer distribution throughout the myocardium.   ? ?Past Surgical History:  ?Procedure Laterality Date  ? lap band surgery 2008    ? 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 ? 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 ? ?FH: alcoholism- deceased-brother ?Liver /lung cancer dad ?CVA-mother ? ?Allergy-NKDA ?? ?Meds ?lisinopril ?Calcium citrate  ?MVT ( bariatric advantage multivitamin) ? ? ?REVIEW OF SYSTEMS:  ?Const: negative fever, negative chills,lost 6 pounds ? ?Eyes: negative diplopia or visual changes, negative eye pain ?ENT: has coryza, negative sore throat ?Broken teeth- got a  new plate ?Resp: negative cough, hemoptysis, dyspnea ?Cards: negative for chest pain, palpitations, lower extremity edema ?GU: negative for frequency, dysuria and hematuria ?Skin: negative for rash and pruritus ?Heme: negative for easy bruising and gum/nose bleeding ?MSK  has some generalized weakness ?Neurolo:negative for headaches, dizziness, vertigo, memory problems  ?Psych: negative for feelings of anxiety, depression  ? ?Objective:  ?VITALS:  ?BP 137/87   Pulse 62   Temp (!) 97.4 ?F (36.3 ?C) (Temporal)   Ht 5\' 5"  (1.651 m)   Wt 213 lb (96.6 kg)   BMI 35.45 kg/m?   ?PHYSICAL EXAM:  ?General: Alert, cooperative,  no distress, appears stated age.  ?Head: Normocephalic, without obvious abnormality, atraumatic. ?Eyes: Conjunctivae clear, anicteric sclerae. Pupils are equal ?Nose: Nares normal. No drainage or sinus tenderness. ?Throat: Lips, mucosa, and tongue normal. No Thrush ?Neck: Supple, symmetrical, no adenopathy, thyroid: non tender ?no carotid bruit and no JVD. ?Back: No CVA  tenderness. ?Lungs: Clear to auscultation bilaterally. No Wheezing or Rhonchi. No rales. ?Breast NAD ?Heart: Regular rate and rhythm, no murmur, rub or gallop. ?Abdomen: Soft, non-tender,not distended. Bowel sounds normal. No masses, lap scars ?Extremities: Extremities normal, atraumatic, no cyanosis. No edema. No clubbing ?Skin: No rashes or lesions. Not Jaundiced ?Lymph: Cervical, supraclavicular normal. ?Neurologic: Grossly non-focal ? ?Health maintenance ?Vaccination ? ?pneumovac- 23-9/26/19 ?Prevnar-13-11/30/18 ?HepB unknown ?HepA unknown ?TdaP not known ?Flu this season ?Herpes zoster- ?MRNA vaccine ( pfizer X3) ?Flu vaccine 2021 ?______________________ ?labs ?RPR-NR on Jan 2023 ?HEPC ab-NR on 05/14/2018 ?Lipid- 07/25/21 - TC 147,TGL 68, HDL 62, LDL 71, ?HIV VL<20 ?Cd4  617 ( 36%) Jan 2023 ?quantiferon Gold-nonreactive from Jan 2023 ?[id number] ?Geno sure archive done on 05/14/2018 does not show significant mutation. ? HIV antibody ?TSH 1.25 ( 06/13/19) ?------------------------------------------------------------ ? ?Preventive  ?Dental- followed currently at dental works ?Colonoscopy- 05/22/19 ?Opthal ?cervical pap-not sexually active and has not had it in many years ?Mammogram-04/02/19 ? ?Impression/Recommendation ??76 year old female with history of HIV AIDS diagnosed in the 62s   ?? ??HIV AIDS: Last viral load is less than 20 and CD4 is 617( 37) She is on  cabo+rilpavirine im injection . ) ?Follow up  2 months for ongoing injections every 2 months ? ?Abnormal Lfts on 4/11- ?History of autoimmune hepatitis was on prednisone and azathioprine when she was in [place] but not anymore.  There is a possibility that it was drug-induced hepatitis.( AST 54, ALT 45 and bili 1.6--all normal in April 2023) ?Will check HEPC/HEPB ? ?Hypokalemia- inspite of lisinopril- new- on K supplement from her PCP ?Blood in urine ?normal creatinine ? ?Leucopenia- chronic and stable ?- ? ?Hypertension lisinopril by cardiologist  Dr.K ? ?History of coronary artery disease diagnosed when she had a work-up for the first lap band surgery ?SPECT scan done 03/28/18 was normal  ? ? ?   ?Obesity: Had lap band surgery in 2008 and it was removed in 2018 because of the port infection.  On 05/30/2018 she underwent Roux-en-Y bypass ? ?Discussed the management with the patient in great detail ? ?Follow up 33months ?Labs today ? ?

## 2021-08-02 NOTE — Patient Instructions (Addendum)
You are ehre for follow up- today will do some labs as your liver function is a bit abnormal ?Will follow up 2 months for cabenua injection ?

## 2021-08-03 LAB — T-HELPER CELLS CD4/CD8 %
% CD 4 Pos. Lymph.: 42.2 % (ref 30.8–58.5)
Absolute CD 4 Helper: 633 /uL (ref 359–1519)
Basophils Absolute: 0 10*3/uL (ref 0.0–0.2)
Basos: 1 %
CD3+CD4+ Cells/CD3+CD8+ Cells Bld: 1.71 (ref 0.92–3.72)
CD3+CD8+ Cells # Bld: 371 /uL (ref 109–897)
CD3+CD8+ Cells NFr Bld: 24.7 % (ref 12.0–35.5)
EOS (ABSOLUTE): 0 10*3/uL (ref 0.0–0.4)
Eos: 1 %
Hematocrit: 35.7 % (ref 34.0–46.6)
Hemoglobin: 12.1 g/dL (ref 11.1–15.9)
Immature Grans (Abs): 0 10*3/uL (ref 0.0–0.1)
Immature Granulocytes: 0 %
Lymphocytes Absolute: 1.5 10*3/uL (ref 0.7–3.1)
Lymphs: 43 %
MCH: 27.9 pg (ref 26.6–33.0)
MCHC: 33.9 g/dL (ref 31.5–35.7)
MCV: 82 fL (ref 79–97)
Monocytes Absolute: 0.3 10*3/uL (ref 0.1–0.9)
Monocytes: 8 %
Neutrophils Absolute: 1.7 10*3/uL (ref 1.4–7.0)
Neutrophils: 47 %
Platelets: 232 10*3/uL (ref 150–450)
RBC: 4.34 x10E6/uL (ref 3.77–5.28)
RDW: 13 % (ref 11.7–15.4)
WBC: 3.6 10*3/uL (ref 3.4–10.8)

## 2021-08-03 LAB — HIV-1 RNA QUANT-NO REFLEX-BLD
HIV 1 RNA Quant: <20 copies/mL
LOG10 HIV-1 RNA: UNDETERMINED log10copy/mL

## 2021-08-08 ENCOUNTER — Telehealth: Payer: Self-pay

## 2021-08-08 NOTE — Telephone Encounter (Signed)
-----   Message from Lynn Ito, MD sent at 08/08/2021 10:19 AM EDT ----- ?Regarding: FW: ?Please let the patient know her labs look good . she changed to cabenuva 3 months ago ?----- Message ----- ?From: Interface, Lab In [place] ?Sent: 08/02/2021   5:23 PM EDT ?To: Lynn Ito, MD ? ?

## 2021-09-19 ENCOUNTER — Other Ambulatory Visit (HOSPITAL_COMMUNITY): Payer: Self-pay

## 2021-09-20 ENCOUNTER — Other Ambulatory Visit (HOSPITAL_COMMUNITY): Payer: Self-pay

## 2021-09-20 ENCOUNTER — Ambulatory Visit: Payer: Medicare Other | Admitting: Infectious Diseases

## 2021-09-22 ENCOUNTER — Other Ambulatory Visit (HOSPITAL_COMMUNITY): Payer: Self-pay

## 2021-10-04 ENCOUNTER — Ambulatory Visit: Payer: Medicare HMO | Attending: Infectious Diseases | Admitting: Infectious Diseases

## 2021-10-04 ENCOUNTER — Encounter: Payer: Self-pay | Admitting: Infectious Diseases

## 2021-10-04 VITALS — BP 138/89 | HR 64 | Temp 96.8°F | Ht 65.0 in | Wt 212.0 lb

## 2021-10-04 DIAGNOSIS — E876 Hypokalemia: Secondary | ICD-10-CM | POA: Diagnosis not present

## 2021-10-04 DIAGNOSIS — I10 Essential (primary) hypertension: Secondary | ICD-10-CM | POA: Insufficient documentation

## 2021-10-04 DIAGNOSIS — R319 Hematuria, unspecified: Secondary | ICD-10-CM | POA: Insufficient documentation

## 2021-10-04 DIAGNOSIS — Z9884 Bariatric surgery status: Secondary | ICD-10-CM | POA: Insufficient documentation

## 2021-10-04 DIAGNOSIS — Z7901 Long term (current) use of anticoagulants: Secondary | ICD-10-CM | POA: Diagnosis not present

## 2021-10-04 DIAGNOSIS — D72819 Decreased white blood cell count, unspecified: Secondary | ICD-10-CM | POA: Insufficient documentation

## 2021-10-04 DIAGNOSIS — B20 Human immunodeficiency virus [HIV] disease: Secondary | ICD-10-CM | POA: Diagnosis not present

## 2021-10-04 DIAGNOSIS — I2510 Atherosclerotic heart disease of native coronary artery without angina pectoris: Secondary | ICD-10-CM | POA: Diagnosis not present

## 2021-10-04 DIAGNOSIS — Z6835 Body mass index (BMI) 35.0-35.9, adult: Secondary | ICD-10-CM | POA: Insufficient documentation

## 2021-10-04 MED ORDER — CABOTEGRAVIR & RILPIVIRINE ER 600 & 900 MG/3ML IM SUER
1.0000 | Freq: Once | INTRAMUSCULAR | Status: AC
  Administered 2021-10-04: 1 via INTRAMUSCULAR

## 2021-10-04 NOTE — Progress Notes (Signed)
NAME: Becky Lee  DOB: August 07, 1945  MRN: [id number]  Date/Time: 10/04/2021 11:36 AM Subjective:   Pt is here for follow up for HIV and to receive Cabenua injection She has been doing very well with injection every 2 months No side effects She says  this is the first time she has never thought about Hiv every day.since her diagnosis 39 yrs ago  Las VL < 20 Alk po4 minimally elevated at 115  Following taken from notes before Becky Lee is a 76 y.o. female with a history of HIV, HTN, Morbid obesity, had lap band surgery, port infection, removal of band in 2018, autoimmune hepatitis.  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 Carmelina Noun at [facility] for bariatric surgery in [place].   She was diagnosed in 1984 and she states it was secondary to a blood transfusion she had for total abdominal hysterectomy.  She remembers that her nadir CD4 was in single digits.  She was in [place] then.   Her cd4 1118 and Vl < 20 on Sep 07, 2017.. 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 Not sexually active in many years now  AIDS diagnosed 1984 Nadir Cd4 <50 OI - zoster,  HAARt history- many regimens Combivir, Farrel Demark Descovy + tivicay- DC 04/28/21 Cabenuva 04/28/21  Acquired thru- blood transfusion for TAH in 1984  ? Past Medical History:  Diagnosis Date   HIV (human immunodeficiency virus infection) ([place])    Hypertension    morbid obesity Autoimmune hepatitis  In 2008- took steroids and azothiprine Fibroid uterus with mennorhagia 12/19/ 2019- SPECT study FINDINGS: Regional wall motion:  reveals normal myocardial thickening and wall motion.The  overall quality of the study is good.   Left ventricular cavity: normal. Perfusion Analysis:  SPECT images demonstrate homogeneous tracer distribution throughout the myocardium.    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.  FH: alcoholism- deceased-brother Liver /lung cancer dad CVA-mother  Allergy-NKDA ? Meds lisinopril Calcium citrate  MVT ( bariatric advantage multivitamin)   REVIEW OF SYSTEMS:  Const: negative fever, negative chills,lost 6 pounds  Eyes: negative diplopia or visual changes, negative eye pain ENT: has coryza, negative sore throat Broken teeth- got a  new plate Resp: negative cough, hemoptysis, dyspnea Cards: negative for chest pain, palpitations, lower extremity edema GU: negative for frequency, dysuria and hematuria Skin: negative for rash and pruritus Heme: negative for easy bruising and gum/nose bleeding MSK  has some generalized weakness Neurolo:negative for headaches, dizziness, vertigo, memory problems  Psych: negative for feelings of anxiety, depression   Objective:  VITALS:  BP 138/89   Pulse 64   Temp (!) 96.8 F (36 C) (Temporal)   Ht 5' 5"  (1.651 m)   Wt 212 lb (96.2 kg)   BMI 35.28 kg/m   PHYSICAL EXAM:  General: Alert, cooperative, no distress, appears stated age.  Head: Normocephalic, without obvious abnormality, atraumatic. Eyes: Conjunctivae clear, anicteric sclerae. Pupils are equal Nose: Nares normal. No drainage or sinus tenderness. Throat: Lips, mucosa, and tongue normal. No Thrush Neck: Supple, symmetrical, no adenopathy, thyroid: non tender no carotid bruit and no JVD. Back: No CVA tenderness. Lungs:  Clear to auscultation bilaterally. No Wheezing or Rhonchi. No rales. Breast NAD Heart: Regular rate and rhythm, no murmur, rub or gallop. Abdomen: Soft, non-tender,not distended. Bowel sounds normal. No masses, lap scars Extremities: Extremities normal, atraumatic, no cyanosis. No edema. No clubbing Skin: No rashes or lesions. Not Jaundiced Lymph: Cervical, supraclavicular normal. Neurologic: Grossly non-focal  Health maintenance Vaccination  pneumovac- 23-9/26/19 Prevnar-13-11/30/18 HepB unknown HepA unknown TdaP not known Flu this season Herpes zoster- MRNA vaccine ( pfizer X3) Flu vaccine 2021 ______________________ labs RPR-NR on Jan 2023 [place] ab-NR on 05/14/2018 Lipid- 07/25/21 - TC 147,TGL 68, HDL 62, LDL 71, HIV VL<20 Cd4  617 ( 36%) Jan 2023 quantiferon Gold-nonreactive from Jan 2023 [id number] Geno sure archive done on 05/14/2018 does not show significant mutation.  HIV antibody TSH 1.25 ( 06/13/19) ------------------------------------------------------------  Preventive  Dental- followed currently at dental works Colonoscopy- 05/22/19 Opthal cervical pap-not sexually active and has not had it in many years Mammogram-04/02/19  Impression/Recommendation ?76 year old female with history of HIV AIDS diagnosed in the 66s   ? ?HIV AIDS: Last viral load is less than 20 and CD4 is 617( 37) She is on  cabo+rilpavirine im injection . ) Follow up  50month with me  ongoing injections every 2 months  Abnormal Lfts on 4/11- History of autoimmune hepatitis [place]  There is a possibility that it was drug-induced hepatitis.( AST 54, ALT 45 and bili 1.6--all normal in April 2023) Will check HEPC/HEPB  Hypokalemia- inspite of lisinopril- new- on K supplement from her PCP Blood in urine normal creatinine  Leucopenia- chronic and stable -  Hypertension lisinopril by cardiologist Dr.K  History of coronary  artery disease diagnosed when she had a work-up for the first lap band surgery SPECT scan done 03/28/18 was normal       Obesity: Had lap band surgery in 2008 and it was removed in 2018 because of the port infection.  On 05/30/2018 she underwent Roux-en-Y bypass  Discussed the management with the patient in great detail  Follow up 291monthfor injection follow up with me in 4 months

## 2021-11-07 ENCOUNTER — Other Ambulatory Visit (HOSPITAL_COMMUNITY): Payer: Self-pay

## 2021-11-14 ENCOUNTER — Other Ambulatory Visit (HOSPITAL_COMMUNITY): Payer: Self-pay

## 2021-11-15 ENCOUNTER — Other Ambulatory Visit (HOSPITAL_COMMUNITY): Payer: Self-pay

## 2021-11-29 ENCOUNTER — Ambulatory Visit: Payer: Medicare HMO | Attending: Infectious Diseases | Admitting: Infectious Diseases

## 2021-11-29 VITALS — BP 146/84 | HR 59 | Temp 97.0°F | Ht 65.0 in | Wt 211.0 lb

## 2021-11-29 DIAGNOSIS — Z7901 Long term (current) use of anticoagulants: Secondary | ICD-10-CM | POA: Insufficient documentation

## 2021-11-29 DIAGNOSIS — I10 Essential (primary) hypertension: Secondary | ICD-10-CM | POA: Insufficient documentation

## 2021-11-29 DIAGNOSIS — B20 Human immunodeficiency virus [HIV] disease: Secondary | ICD-10-CM | POA: Diagnosis present

## 2021-11-29 DIAGNOSIS — Z6835 Body mass index (BMI) 35.0-35.9, adult: Secondary | ICD-10-CM | POA: Diagnosis not present

## 2021-11-29 DIAGNOSIS — D72819 Decreased white blood cell count, unspecified: Secondary | ICD-10-CM | POA: Diagnosis not present

## 2021-11-29 DIAGNOSIS — I2510 Atherosclerotic heart disease of native coronary artery without angina pectoris: Secondary | ICD-10-CM | POA: Insufficient documentation

## 2021-11-29 MED ORDER — CABOTEGRAVIR & RILPIVIRINE ER 600 & 900 MG/3ML IM SUER
1.0000 | Freq: Once | INTRAMUSCULAR | Status: AC
  Administered 2021-11-29: 1 via INTRAMUSCULAR

## 2021-11-29 NOTE — Progress Notes (Signed)
NAME: Becky Lee  DOB: [birth year]  MRN: [id number]  Date/Time: 11/29/2021 9:21 AM Subjective:   Pt is here for follow up for HIV and to receive Cabenua injection She has been doing very well with injection every 2 months No side effects Doing very well  Las VL < 20   Following taken from notes before Becky Lee is a 76 y.o. female with a history of HIV, HTN, Morbid obesity, had lap band surgery, port infection, removal of band in 2018, autoimmune hepatitis.  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 Barkley Boards at [facility] for bariatric surgery in [place].   She was diagnosed in 1984 and she states it was secondary to a blood transfusion she had for total abdominal hysterectomy.  She remembers that her nadir CD4 was in single digits.  She was in [place] then.   Her cd4 1118 and Vl < 20 on Sep 07, 2017.. 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 Not sexually active in many years now  AIDS diagnosed 1984 Nadir Cd4 <50 OI - zoster,  HAARt history- many regimens Combivir, Valetta Fuller Descovy + tivicay- DC 04/28/21 Cabenuva 04/28/21  Acquired thru- blood transfusion for TAH in 1984  ? Past Medical History:  Diagnosis Date   HIV (human immunodeficiency virus infection) (HCC)    Hypertension    morbid obesity Autoimmune hepatitis  In 2008- took steroids and azothiprine Fibroid uterus with mennorhagia 12/19/ 2019- SPECT study FINDINGS: Regional wall motion:  reveals normal myocardial thickening and wall motion.The overall quality of the study is good.   Left ventricular cavity: normal. Perfusion Analysis:  SPECT images demonstrate  homogeneous tracer distribution throughout the myocardium.    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  FH: alcoholism- deceased-brother Liver /lung cancer dad CVA-mother  Allergy-NKDA ? Meds lisinopril Calcium citrate  MVT ( bariatric advantage multivitamin)   REVIEW OF SYSTEMS:  Const: negative fever, negative chills,lost 6 pounds  Eyes: negative diplopia or visual changes, negative eye pain ENT: has coryza, negative sore throat Broken teeth- got a  new plate Resp: negative cough, hemoptysis, dyspnea Cards: negative for chest pain, palpitations, lower extremity edema GU: negative for frequency, dysuria and hematuria Skin: negative for rash and pruritus Heme: negative for easy bruising and gum/nose bleeding MSK  has some generalized weakness Neurolo:negative for headaches, dizziness, vertigo, memory problems  Psych: negative for feelings of anxiety, depression   Objective:  VITALS:  BP (!) 146/84   Pulse (!) 59   Temp (!) 97 F (36.1 C) (Temporal)   Ht 5\' 5"  (1.651 m)   Wt 211 lb (95.7 kg)   BMI 35.11 kg/m   PHYSICAL EXAM:  General: Alert, cooperative, no distress, appears stated age.  Lungs: Clear to auscultation bilaterally. No Wheezing or Rhonchi. No rales. Breast NAD Heart: Regular rate and rhythm, no murmur, rub  or gallop. Abdomen: Soft, non-tender,not distended. Bowel sounds normal. No masses, lap scars Extremities: Extremities normal, atraumatic, no cyanosis. No edema. No clubbing Skin: No rashes or lesions. Not Jaundiced Lymph: Cervical, supraclavicular normal. Neurologic: Grossly non-focal  Health  maintenance Vaccination  pneumovac- 23-9/26/19 Prevnar-13-11/30/18 HepB unknown HepA unknown TdaP not known Flu this season Herpes zoster- MRNA vaccine ( pfizer X3) Flu vaccine 2021 ______________________ labs RPR-NR on Jan 2023 HEPC ab-NR on 05/14/2018 Lipid- 07/25/21 - TC 147,TGL 68, HDL 62, LDL 71, HIV VL<20 Cd4  617 ( 36%) Jan 2023 quantiferon Gold-nonreactive from Jan 2023 [id number] Geno sure archive done on 05/14/2018 does not show significant mutation.  HIV antibody TSH 1.25 ( 06/13/19) ------------------------------------------------------------  Preventive  Dental- followed currently at dental works Colonoscopy- 05/22/19 Opthal cervical pap-not sexually active and has not had it in many years Mammogram-04/02/19  Impression/Recommendation ?76 year old female with history of HIV AIDS diagnosed in the 63s   ? ?HIV AIDS: Last viral load is less than 20 and CD4 is 617( 37) She is on  cabo+rilpavirine im injection . ) Follow up  73months with me  ongoing injections every 2 months  Abnormal Lfts on 4/11- History of autoimmune hepatitis was on prednisone and azathioprine when she was in [place] but not anymore.  There is a possibility that it was drug-induced hepatitis.( AST 54, ALT 45 and bili 1.6--all normal in April 2023) Will check HEPC/HEPB   Leucopenia- chronic and stable -  Hypertension lisinopril by cardiologist Dr.K  History of coronary artery disease diagnosed when she had a work-up for the first lap band surgery SPECT scan done 03/28/18 was normal       Obesity: Had lap band surgery in 2008 and it was removed in 2018 because of the port infection.  On 05/30/2018 she underwent Roux-en-Y bypass  Discussed the management with the patient in great detail   follow up with me october

## 2021-12-13 ENCOUNTER — Other Ambulatory Visit (HOSPITAL_COMMUNITY): Payer: Self-pay

## 2022-01-05 ENCOUNTER — Other Ambulatory Visit (HOSPITAL_COMMUNITY): Payer: Self-pay

## 2022-01-09 ENCOUNTER — Other Ambulatory Visit (HOSPITAL_COMMUNITY): Payer: Self-pay

## 2022-01-10 ENCOUNTER — Other Ambulatory Visit (HOSPITAL_COMMUNITY): Payer: Self-pay

## 2022-01-23 ENCOUNTER — Other Ambulatory Visit (HOSPITAL_COMMUNITY): Payer: Self-pay

## 2022-01-24 ENCOUNTER — Ambulatory Visit: Payer: Medicare HMO | Attending: Infectious Diseases | Admitting: Infectious Diseases

## 2022-01-24 ENCOUNTER — Encounter: Payer: Self-pay | Admitting: Infectious Diseases

## 2022-01-24 ENCOUNTER — Other Ambulatory Visit
Admission: RE | Admit: 2022-01-24 | Discharge: 2022-01-24 | Disposition: A | Discharge status: Home / Self Care | Payer: Medicare HMO | Attending: Infectious Diseases | Admitting: Infectious Diseases

## 2022-01-24 VITALS — BP 137/87 | HR 96 | Temp 96.6°F | Ht 65.0 in | Wt 214.0 lb

## 2022-01-24 DIAGNOSIS — Z9884 Bariatric surgery status: Secondary | ICD-10-CM | POA: Diagnosis not present

## 2022-01-24 DIAGNOSIS — D72819 Decreased white blood cell count, unspecified: Secondary | ICD-10-CM | POA: Insufficient documentation

## 2022-01-24 DIAGNOSIS — Z79624 Long term (current) use of inhibitors of nucleotide synthesis: Secondary | ICD-10-CM | POA: Diagnosis not present

## 2022-01-24 DIAGNOSIS — Z6835 Body mass index (BMI) 35.0-35.9, adult: Secondary | ICD-10-CM | POA: Insufficient documentation

## 2022-01-24 DIAGNOSIS — I2510 Atherosclerotic heart disease of native coronary artery without angina pectoris: Secondary | ICD-10-CM | POA: Diagnosis not present

## 2022-01-24 DIAGNOSIS — K754 Autoimmune hepatitis: Secondary | ICD-10-CM | POA: Insufficient documentation

## 2022-01-24 DIAGNOSIS — Z7901 Long term (current) use of anticoagulants: Secondary | ICD-10-CM | POA: Diagnosis not present

## 2022-01-24 DIAGNOSIS — B20 Human immunodeficiency virus [HIV] disease: Secondary | ICD-10-CM | POA: Insufficient documentation

## 2022-01-24 DIAGNOSIS — I10 Essential (primary) hypertension: Secondary | ICD-10-CM | POA: Diagnosis not present

## 2022-01-24 DIAGNOSIS — R7989 Other specified abnormal findings of blood chemistry: Secondary | ICD-10-CM | POA: Diagnosis not present

## 2022-01-24 LAB — COMPREHENSIVE METABOLIC PANEL
ALT: 32 U/L (ref 0–44)
AST: 40 U/L (ref 15–41)
Albumin: 3.6 g/dL (ref 3.5–5.0)
Alkaline Phosphatase: 97 U/L (ref 38–126)
Anion gap: 5 (ref 5–15)
BUN: 14 mg/dL (ref 8–23)
CO2: 27 mmol/L (ref 22–32)
Calcium: 9.7 mg/dL (ref 8.9–10.3)
Chloride: 110 mmol/L (ref 98–111)
Creatinine, Ser: 0.86 mg/dL (ref 0.44–1.00)
GFR, Estimated: >60 mL/min (ref >60)
Glucose, Bld: 97 mg/dL (ref 70–99)
Potassium: 3.8 mmol/L (ref 3.5–5.1)
Sodium: 142 mmol/L (ref 135–145)
Total Bilirubin: 1.5 mg/dL — ABNORMAL HIGH (ref 0.3–1.2)
Total Protein: 7.5 g/dL (ref 6.5–8.1)

## 2022-01-24 LAB — CBC WITH DIFFERENTIAL/PLATELET
Abs Immature Granulocytes: 0.01 10*3/uL (ref 0.00–0.07)
Basophils Absolute: 0 10*3/uL (ref 0.0–0.1)
Basophils Relative: 1 %
Eosinophils Absolute: 0 10*3/uL (ref 0.0–0.5)
Eosinophils Relative: 1 %
HCT: 36.6 % (ref 36.0–46.0)
Hemoglobin: 12.7 g/dL (ref 12.0–15.0)
Immature Granulocytes: 0 %
Lymphocytes Relative: 57 %
Lymphs Abs: 1.6 10*3/uL (ref 0.7–4.0)
MCH: 28.8 pg (ref 26.0–34.0)
MCHC: 34.7 g/dL (ref 30.0–36.0)
MCV: 83 fL (ref 80.0–100.0)
Monocytes Absolute: 0.2 10*3/uL (ref 0.1–1.0)
Monocytes Relative: 8 %
Neutro Abs: 0.9 10*3/uL — ABNORMAL LOW (ref 1.7–7.7)
Neutrophils Relative %: 33 %
Platelets: 180 10*3/uL (ref 150–400)
RBC: 4.41 MIL/uL (ref 3.87–5.11)
RDW: 13.3 % (ref 11.5–15.5)
Smear Review: NORMAL
WBC: 2.8 10*3/uL — ABNORMAL LOW (ref 4.0–10.5)
nRBC: 0 % (ref 0.0–0.2)

## 2022-01-24 LAB — PATHOLOGIST SMEAR REVIEW

## 2022-01-24 MED ORDER — CABOTEGRAVIR & RILPIVIRINE ER 600 & 900 MG/3ML IM SUER
1.0000 | Freq: Once | INTRAMUSCULAR | Status: AC
  Administered 2022-01-24: 1 via INTRAMUSCULAR

## 2022-01-24 NOTE — Progress Notes (Signed)
NAME: Bhavna Lomax  DOB: [birth year]  MRN: [id number]  Date/Time: 01/24/2022 8:42 AM Subjective:   Pt is here for follow up for HIV and to receive Cabenua injection She has been doing very well with injection every 2 months No side effects Doing very well  Last VL < 20 and cd4 is 633   Following taken from notes before Tayah Danser is a 76 y.o. female with a history of HIV, HTN, Morbid obesity, had lap band surgery, port infection, removal of band in 2018, autoimmune hepatitis.  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 Carmelina Noun at [facility] for bariatric surgery in [place].   She was diagnosed in 1984 and she states it was secondary to a blood transfusion she had for total abdominal hysterectomy.  She remembers that her nadir CD4 was in single digits.  She was in [place] then.   Her cd4 1118 and Vl < 20 on Sep 07, 2017.. 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 Not sexually active in many years now  AIDS diagnosed 1984 Nadir Cd4 <50 OI - zoster,  HAARt history- many regimens Combivir, Farrel Demark Descovy + tivicay- DC 04/28/21 Cabenuva 04/28/21  Acquired thru- blood transfusion for TAH in 1984  ? Past Medical History:  Diagnosis Date   HIV (human immunodeficiency virus infection) ([place])    Hypertension    morbid obesity Autoimmune hepatitis  In 2008- took steroids and azothiprine Fibroid uterus with mennorhagia 12/19/ 2019- SPECT study FINDINGS: Regional wall motion:  reveals normal myocardial thickening and wall motion.The overall quality of the study is good.   Left ventricular cavity: normal. Perfusion Analysis:  SPECT images  demonstrate homogeneous tracer distribution throughout the myocardium.    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  FH: alcoholism- deceased-brother Liver /lung cancer dad CVA-mother  Allergy-NKDA ? Meds lisinopril MVT ( bariatric advantage multivitamin) Cabenuva   REVIEW OF SYSTEMS:  Const: negative fever, negative chills,lost 6 pounds  Eyes: negative diplopia or visual changes, negative eye pain ENT: has coryza, negative sore throat Broken teeth- got a  new plate Resp: negative cough, hemoptysis, dyspnea Cards: negative for chest pain, palpitations, lower extremity edema GU: negative for frequency, dysuria and hematuria Skin: negative for rash and pruritus Heme: negative for easy bruising and gum/nose bleeding MSK   Neurolo:negative for headaches, dizziness, vertigo, memory problems  Psych: negative for feelings of anxiety, depression   Objective:  VITALS:  BP 137/87   Pulse 96   Temp (!) 96.6 F (35.9 C) (Temporal)   Ht 5\' 5"  (1.651 m)   Wt 214 lb (97.1 kg)   BMI 35.61 kg/m   PHYSICAL EXAM:  General: looks well HEENT N.  Lungs: Clear to auscultation bilaterally. No Wheezing or Rhonchi. No rales. Heart: Regular rate and rhythm, no murmur, rub or gallop. Abdomen:did not examine today Extremities: Extremities  normal, atraumatic, no cyanosis. No edema. No clubbing Skin: No rashes or lesions. Not Jaundiced Lymph: Cervical, supraclavicular normal. Neurologic: Grossly non-focal  Health maintenance Vaccination  pneumovac- 23-  01/03/18 Prevnar-13-11/30/18 HepB unknown HepA unknown TdaP 07/28/20 Flu this season Herpes zoster-09/12/19-  2nd  dose - given - she will get the date MRNA vaccine ( pfizer X3) Flu vaccine 2022 ______________________ labs RPR-NR on Jan 2023 [place] ab-NR on 05/14/2018 Lipid- 07/25/21 - TC 147,TGL 68, HDL 62, LDL 71, HIV VL<20 Cd4  617 ( 36%) Jan 2023 quantiferon Gold-nonreactive from Jan 2023 [id number] Geno sure archive done on 05/14/2018 does not show significant mutation.  HIV antibody TSH 1.25 ( 06/13/19) ------------------------------------------------------------  Preventive  Dental- followed currently at dental works Colonoscopy- 05/22/19 Opthal cervical pap-not sexually active and has not had it in many years Mammogram-04/02/19  Impression/Recommendation ?76 year old female with history of HIV AIDS diagnosed in the 17s   ? ?HIV AIDS: Last viral load is less than 20 and CD4 is 633  She is on  cabo+rilpavirine im injection . )   Abnormal Lfts on 07/19/21- History of autoimmune hepatitis was on prednisone and azathioprine when she was in [place] but not anymore.  There is a possibility that it was drug-induced hepatitis.( AST 54, ALT 45 and bili 1.6--all normal in April 2023) /HEPB/HEPC/HEPA neg   Leucopenia- chronic and stable -  Hypertension lisinopril by cardiologist Dr.K  History of coronary artery disease diagnosed when she had a work-up for the first lap band surgery SPECT scan done 03/28/18 was normal   Statin -contraindicated because of autoimmune hepatitis and abnormal LFTS  Lipids -very good ( TC 147/ Hdl 62/LDL 71/TGL 147)    Obesity: Had lap band surgery in 2008 and it was removed in 2018 because of the port infection.  On 05/30/2018 she underwent Roux-en-Y bypass  Discussed the management with the patient in great detail  Labs today Follow up in Dec for cabenuva Follow up with me in Feb 2024

## 2022-01-25 LAB — HIV-1 RNA QUANT-NO REFLEX-BLD
HIV 1 RNA Quant: <20 copies/mL
LOG10 HIV-1 RNA: UNDETERMINED log10copy/mL

## 2022-01-25 LAB — T-HELPER CELLS CD4/CD8 %
% CD 4 Pos. Lymph.: 43.8 % (ref 30.8–58.5)
Absolute CD 4 Helper: 701 /uL (ref 359–1519)
Basophils Absolute: 0 10*3/uL (ref 0.0–0.2)
Basos: 1 %
CD3+CD4+ Cells/CD3+CD8+ Cells Bld: 1.89 (ref 0.92–3.72)
CD3+CD8+ Cells # Bld: 371 /uL (ref 109–897)
CD3+CD8+ Cells NFr Bld: 23.2 % (ref 12.0–35.5)
EOS (ABSOLUTE): 0 10*3/uL (ref 0.0–0.4)
Eos: 1 %
Hematocrit: 37.4 % (ref 34.0–46.6)
Hemoglobin: 12.8 g/dL (ref 11.1–15.9)
Immature Grans (Abs): 0 10*3/uL (ref 0.0–0.1)
Immature Granulocytes: 0 %
Lymphocytes Absolute: 1.6 10*3/uL (ref 0.7–3.1)
Lymphs: 58 %
MCH: 28.6 pg (ref 26.6–33.0)
MCHC: 34.2 g/dL (ref 31.5–35.7)
MCV: 84 fL (ref 79–97)
Monocytes Absolute: 0.3 10*3/uL (ref 0.1–0.9)
Monocytes: 9 %
Neutrophils Absolute: 0.9 10*3/uL — ABNORMAL LOW (ref 1.4–7.0)
Neutrophils: 31 %
Platelets: 192 10*3/uL (ref 150–450)
RBC: 4.47 x10E6/uL (ref 3.77–5.28)
RDW: 13.9 % (ref 11.7–15.4)
WBC: 2.8 10*3/uL — ABNORMAL LOW (ref 3.4–10.8)

## 2022-01-25 LAB — RPR: RPR Ser Ql: NONREACTIVE

## 2022-01-26 LAB — QUANTIFERON-TB GOLD PLUS (RQFGPL)
QuantiFERON Mitogen Value: 5.72 IU/mL
QuantiFERON Nil Value: 0.04 IU/mL
QuantiFERON TB1 Ag Value: 0.05 IU/mL
QuantiFERON TB2 Ag Value: 0.04 IU/mL

## 2022-01-26 LAB — QUANTIFERON-TB GOLD PLUS: QuantiFERON-TB Gold Plus: NEGATIVE

## 2022-02-08 ENCOUNTER — Ambulatory Visit
Admission: RE | Admit: 2022-02-08 | Discharge: 2022-02-08 | Disposition: A | Discharge status: Home / Self Care | Payer: Medicare HMO | Source: Ambulatory Visit | Attending: Internal Medicine | Admitting: Internal Medicine

## 2022-02-08 DIAGNOSIS — Z1231 Encounter for screening mammogram for malignant neoplasm of breast: Secondary | ICD-10-CM | POA: Diagnosis present

## 2022-03-21 ENCOUNTER — Other Ambulatory Visit (HOSPITAL_COMMUNITY): Payer: Self-pay

## 2022-03-23 ENCOUNTER — Telehealth: Payer: Self-pay

## 2022-03-23 NOTE — Telephone Encounter (Signed)
[facility] Patient Advocate Encounter  Patient's medication Becky Lee) have been couriered to [facility] from [facility] and will be administered on the patient next office visit on 03/28/22.  [place] will take the medication to [facility] for patient appointment.  Clearance Coots , CPhT Specialty Pharmacy Patient [facility] Phone: [phone] Fax:  [phone]

## 2022-03-28 ENCOUNTER — Encounter: Payer: Self-pay | Admitting: Infectious Diseases

## 2022-03-28 ENCOUNTER — Ambulatory Visit: Payer: Medicare HMO | Attending: Infectious Diseases | Admitting: Infectious Diseases

## 2022-03-28 VITALS — BP 137/88 | HR 60 | Temp 96.6°F | Ht 65.0 in | Wt 217.0 lb

## 2022-03-28 DIAGNOSIS — B20 Human immunodeficiency virus [HIV] disease: Secondary | ICD-10-CM | POA: Diagnosis present

## 2022-03-28 MED ORDER — CABOTEGRAVIR & RILPIVIRINE ER 600 & 900 MG/3ML IM SUER
1.0000 | Freq: Once | INTRAMUSCULAR | Status: AC
  Administered 2022-03-28: 1 via INTRAMUSCULAR

## 2022-03-28 NOTE — Progress Notes (Signed)
ID Pt here to get cabenuva injection for HIV Doing very well Last Vl < 20 and Cd4 700 in oct 2023 Saw Dr.Hande recently Had mammogram Vitals BP 137/88   Pulse 60   Temp (!) 96.6 F (35.9 C) (Temporal)   Ht 5\' 5"  (1.651 m)   Wt 217 lb (98.4 kg)   BMI 36.11 kg/m   Chest CTA Hss1s2 CNS non focal Impression/recommendation HIV / here for cabenuva injection Follow for medical visit in Feb 2024

## 2022-05-15 ENCOUNTER — Other Ambulatory Visit: Payer: Self-pay | Admitting: Pharmacist

## 2022-05-15 ENCOUNTER — Other Ambulatory Visit: Payer: Self-pay

## 2022-05-15 ENCOUNTER — Other Ambulatory Visit (HOSPITAL_COMMUNITY): Payer: Self-pay

## 2022-05-15 DIAGNOSIS — B20 Human immunodeficiency virus [HIV] disease: Secondary | ICD-10-CM

## 2022-05-15 MED ORDER — CABOTEGRAVIR & RILPIVIRINE ER 600 & 900 MG/3ML IM SUER
1.0000 | INTRAMUSCULAR | 5 refills | Status: DC
  Filled 2022-05-15 (×2): qty 6, 60d supply, fill #0
  Filled 2022-07-13: qty 6, 60d supply, fill #1
  Filled 2022-09-15: qty 6, 60d supply, fill #2
  Filled 2022-11-14: qty 6, 60d supply, fill #3
  Filled 2023-01-11: qty 6, 60d supply, fill #4
  Filled 2023-03-12: qty 6, 60d supply, fill #5

## 2022-05-16 ENCOUNTER — Other Ambulatory Visit: Payer: Self-pay

## 2022-05-26 ENCOUNTER — Other Ambulatory Visit (HOSPITAL_COMMUNITY): Payer: Self-pay

## 2022-05-30 ENCOUNTER — Ambulatory Visit: Payer: Medicare HMO | Attending: Infectious Diseases | Admitting: Infectious Diseases

## 2022-05-30 ENCOUNTER — Encounter: Payer: Self-pay | Admitting: Infectious Diseases

## 2022-05-30 ENCOUNTER — Other Ambulatory Visit
Admission: RE | Admit: 2022-05-30 | Discharge: 2022-05-30 | Disposition: A | Discharge status: Home / Self Care | Payer: Medicare HMO | Attending: Infectious Diseases | Admitting: Infectious Diseases

## 2022-05-30 VITALS — BP 129/90 | HR 68 | Temp 96.6°F | Ht 65.0 in | Wt 217.0 lb

## 2022-05-30 DIAGNOSIS — I10 Essential (primary) hypertension: Secondary | ICD-10-CM | POA: Insufficient documentation

## 2022-05-30 DIAGNOSIS — R7989 Other specified abnormal findings of blood chemistry: Secondary | ICD-10-CM | POA: Diagnosis not present

## 2022-05-30 DIAGNOSIS — K754 Autoimmune hepatitis: Secondary | ICD-10-CM | POA: Diagnosis not present

## 2022-05-30 DIAGNOSIS — Z9884 Bariatric surgery status: Secondary | ICD-10-CM | POA: Insufficient documentation

## 2022-05-30 DIAGNOSIS — Z6836 Body mass index (BMI) 36.0-36.9, adult: Secondary | ICD-10-CM | POA: Diagnosis not present

## 2022-05-30 DIAGNOSIS — D72819 Decreased white blood cell count, unspecified: Secondary | ICD-10-CM | POA: Diagnosis not present

## 2022-05-30 DIAGNOSIS — I2510 Atherosclerotic heart disease of native coronary artery without angina pectoris: Secondary | ICD-10-CM | POA: Diagnosis not present

## 2022-05-30 DIAGNOSIS — B20 Human immunodeficiency virus [HIV] disease: Secondary | ICD-10-CM | POA: Diagnosis not present

## 2022-05-30 DIAGNOSIS — Z79624 Long term (current) use of inhibitors of nucleotide synthesis: Secondary | ICD-10-CM | POA: Diagnosis not present

## 2022-05-30 LAB — HEPATITIS A ANTIBODY, TOTAL: hep A Total Ab: REACTIVE — AB

## 2022-05-30 MED ORDER — CABOTEGRAVIR & RILPIVIRINE ER 600 & 900 MG/3ML IM SUER
1.0000 | Freq: Once | INTRAMUSCULAR | Status: AC
  Administered 2022-05-30: 1 via INTRAMUSCULAR

## 2022-05-30 NOTE — Progress Notes (Signed)
NAME: Becky Lee  DOB: 08/26/1945  MRN: [id number]  Date/Time: 05/30/2022 8:45 AM Subjective:   Pt is here for follow up for HIV  She has been doing very well with injection every 2 months No side effects Doing very well  Last VL < 20 and cd4 is 633   Following taken from notes before Becky Lee is a 77 y.o. female with a history of HIV, HTN, Morbid obesity, had lap band surgery, port infection, removal of band in 2018, autoimmune hepatitis.  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 Carmelina Noun at [facility] for bariatric surgery in [place].   She was diagnosed in 1984 and she states it was secondary to a blood transfusion she had for total abdominal hysterectomy.  She remembers that her nadir CD4 was in single digits.  She was in [place] then.   Her cd4 1118 and Vl < 20 on Sep 07, 2017.. 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 Not sexually active in many years now  AIDS diagnosed 1984 Nadir Cd4 <50 OI - zoster,  HAARt history- many regimens Combivir, Farrel Demark Descovy + tivicay- DC 04/28/21 Cabenuva 04/28/21  Acquired thru- blood transfusion for TAH in 1984  ? Past Medical History:  Diagnosis Date   HIV (human immunodeficiency virus infection) ([place])    Hypertension    morbid obesity Autoimmune hepatitis  In 2008- took steroids and azothiprine Fibroid uterus with mennorhagia 12/19/ 2019- SPECT study FINDINGS: Regional wall motion:  reveals normal myocardial thickening and wall motion.The overall quality of the study is good.   Left ventricular cavity: normal. Perfusion Analysis:  SPECT images demonstrate homogeneous tracer  distribution throughout the myocardium.    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  FH: alcoholism- deceased-brother Liver /lung cancer dad CVA-mother  Allergy-NKDA ? Meds lisinopril MVT ( bariatric advantage multivitamin) Cabenuva   REVIEW OF SYSTEMS:  Const: negative fever, negative chills,lost 6 pounds  Eyes: negative diplopia or visual changes, negative eye pain ENT: has coryza, negative sore throat Broken teeth- got a  new plate Resp: negative cough, hemoptysis, dyspnea Cards: negative for chest pain, palpitations, lower extremity edema GU: negative for frequency, dysuria and hematuria Skin: negative for rash and pruritus Heme: negative for easy bruising and gum/nose bleeding MSK   Neurolo:negative for headaches, dizziness, vertigo, memory problems  Psych: negative for feelings of anxiety, depression   Objective:  VITALS:  BP (!) 129/90   Pulse 68   Temp (!) 96.6 F (35.9 C) (Temporal)   Ht 5' 5"$  (1.651 m)   Wt 217 lb (98.4 kg)   BMI 36.11 kg/m   PHYSICAL EXAM:  General: looks well HEENT N.  Lungs: Clear to auscultation bilaterally. No Wheezing or Rhonchi. No rales. Heart: Regular rate and rhythm, no murmur, rub or gallop. Abdomen:did not examine today Extremities: Extremities normal, atraumatic, no  cyanosis. No edema. No clubbing Skin: No rashes or lesions. Not Jaundiced Lymph: Cervical, supraclavicular normal. Neurologic: Grossly non-focal  Health maintenance Vaccination  pneumovac- 23-  01/03/18 Prevnar-13-11/30/18 HepB unknown HepA unknown TdaP 07/28/20 Flu this season Herpes zoster-09/12/19-  2nd dose - given - she will get  the date MRNA vaccine ( pfizer X3) Flu vaccine 2022 ______________________ labs RPR-NR on Jan 2023 [place] ab-NR on 05/14/2018 Lipid- 07/25/21 - TC 147,TGL 68, HDL 62, LDL 71, HIV VL<20 Cd4  617 ( 36%) Jan 2023 quantiferon Gold-nonreactive from Jan 2023 [id number] Geno sure archive done on 05/14/2018 does not show significant mutation.  HIV antibody TSH 1.25 ( 06/13/19) ------------------------------------------------------------  Preventive  Dental- followed currently at dental works Colonoscopy- 05/22/19 Opthal cervical pap-not sexually active and has not had it in many years Mammogram-04/02/19  Impression/Recommendation ?77 year old female with history of HIV AIDS diagnosed in the 39s   ? ?HIV AIDS: Last viral load is less than 20 and CD4 is 633  She is on  cabo+rilpavirine im injection . ) will do labs today   Abnormal Lfts on 07/19/21- History of autoimmune hepatitis was on prednisone and azathioprine when she was in [place] but not anymore.  There is a possibility that it was drug-induced hepatitis.( AST 54, ALT 45 and bili 1.6--all normal in April 2023) /HEPB/HEPC/HEPA neg   Leucopenia- chronic and stable -  Hypertension lisinopril by cardiologist Dr.K  History of coronary artery disease diagnosed when she had a work-up for the first lap band surgery SPECT scan done 03/28/18 was normal   Statin -contraindicated because of autoimmune hepatitis and abnormal LFTS  Lipids -very good ( TC 147/ Hdl 62/LDL 71/TGL 147)    Obesity: Had lap band surgery in 2008 and it was removed in 2018 because of the port infection.  On 05/30/2018 she underwent Roux-en-Y bypass  Discussed the management with the patient in great detail  Labs today For cabenuva injection today Follow up in 19month for the next injection

## 2022-05-31 LAB — HEPATITIS B SURFACE ANTIBODY, QUANTITATIVE: Hep B S AB Quant (Post): 54.5 m[IU]/mL (ref >9.9)

## 2022-05-31 LAB — T-HELPER CELLS CD4/CD8 %
% CD 4 Pos. Lymph.: 46.4 % (ref 30.8–58.5)
Absolute CD 4 Helper: 696 /uL (ref 359–1519)
Basophils Absolute: 0 10*3/uL (ref 0.0–0.2)
Basos: 1 %
CD3+CD4+ Cells/CD3+CD8+ Cells Bld: 2.18 (ref 0.92–3.72)
CD3+CD8+ Cells # Bld: 320 /uL (ref 109–897)
CD3+CD8+ Cells NFr Bld: 21.3 % (ref 12.0–35.5)
EOS (ABSOLUTE): 0 10*3/uL (ref 0.0–0.4)
Eos: 1 %
Hematocrit: 39 % (ref 34.0–46.6)
Hemoglobin: 13.1 g/dL (ref 11.1–15.9)
Immature Grans (Abs): 0 10*3/uL (ref 0.0–0.1)
Immature Granulocytes: 0 %
Lymphocytes Absolute: 1.5 10*3/uL (ref 0.7–3.1)
Lymphs: 44 %
MCH: 28.3 pg (ref 26.6–33.0)
MCHC: 33.6 g/dL (ref 31.5–35.7)
MCV: 84 fL (ref 79–97)
Monocytes Absolute: 0.3 10*3/uL (ref 0.1–0.9)
Monocytes: 8 %
Neutrophils Absolute: 1.5 10*3/uL (ref 1.4–7.0)
Neutrophils: 46 %
Platelets: 186 10*3/uL (ref 150–450)
RBC: 4.63 x10E6/uL (ref 3.77–5.28)
RDW: 13.9 % (ref 11.7–15.4)
WBC: 3.3 10*3/uL — ABNORMAL LOW (ref 3.4–10.8)

## 2022-05-31 LAB — HIV-1 RNA QUANT-NO REFLEX-BLD
HIV 1 RNA Quant: <20 copies/mL
LOG10 HIV-1 RNA: UNDETERMINED log10copy/mL

## 2022-05-31 LAB — RPR: RPR Ser Ql: NONREACTIVE

## 2022-06-02 LAB — QUANTIFERON-TB GOLD PLUS (RQFGPL)
QuantiFERON Mitogen Value: 9.31 IU/mL
QuantiFERON Nil Value: 0.02 IU/mL
QuantiFERON TB1 Ag Value: 0.01 IU/mL
QuantiFERON TB2 Ag Value: 0.02 IU/mL

## 2022-06-02 LAB — QUANTIFERON-TB GOLD PLUS: QuantiFERON-TB Gold Plus: NEGATIVE

## 2022-06-12 ENCOUNTER — Other Ambulatory Visit (HOSPITAL_COMMUNITY): Payer: Self-pay

## 2022-07-05 ENCOUNTER — Other Ambulatory Visit (HOSPITAL_COMMUNITY): Payer: Self-pay

## 2022-07-10 ENCOUNTER — Other Ambulatory Visit: Payer: Self-pay

## 2022-07-10 ENCOUNTER — Other Ambulatory Visit (HOSPITAL_COMMUNITY): Payer: Self-pay

## 2022-07-13 ENCOUNTER — Other Ambulatory Visit (HOSPITAL_COMMUNITY): Payer: Self-pay

## 2022-07-14 ENCOUNTER — Other Ambulatory Visit (HOSPITAL_COMMUNITY): Payer: Self-pay

## 2022-07-17 ENCOUNTER — Other Ambulatory Visit (HOSPITAL_COMMUNITY): Payer: Self-pay

## 2022-07-25 ENCOUNTER — Other Ambulatory Visit (HOSPITAL_COMMUNITY): Payer: Self-pay

## 2022-07-27 ENCOUNTER — Encounter: Payer: Self-pay | Admitting: Infectious Diseases

## 2022-07-27 ENCOUNTER — Ambulatory Visit: Payer: Medicare HMO | Attending: Infectious Diseases | Admitting: Infectious Diseases

## 2022-07-27 VITALS — BP 96/68 | HR 88 | Temp 97.0°F | Ht 65.0 in | Wt 210.0 lb

## 2022-07-27 DIAGNOSIS — Z79899 Other long term (current) drug therapy: Secondary | ICD-10-CM | POA: Diagnosis not present

## 2022-07-27 DIAGNOSIS — R7989 Other specified abnormal findings of blood chemistry: Secondary | ICD-10-CM | POA: Insufficient documentation

## 2022-07-27 DIAGNOSIS — I10 Essential (primary) hypertension: Secondary | ICD-10-CM | POA: Diagnosis not present

## 2022-07-27 DIAGNOSIS — B20 Human immunodeficiency virus [HIV] disease: Secondary | ICD-10-CM | POA: Insufficient documentation

## 2022-07-27 DIAGNOSIS — K754 Autoimmune hepatitis: Secondary | ICD-10-CM | POA: Diagnosis not present

## 2022-07-27 DIAGNOSIS — I2510 Atherosclerotic heart disease of native coronary artery without angina pectoris: Secondary | ICD-10-CM | POA: Diagnosis not present

## 2022-07-27 DIAGNOSIS — Z6834 Body mass index (BMI) 34.0-34.9, adult: Secondary | ICD-10-CM | POA: Insufficient documentation

## 2022-07-27 DIAGNOSIS — D72819 Decreased white blood cell count, unspecified: Secondary | ICD-10-CM | POA: Diagnosis not present

## 2022-07-27 MED ORDER — CABOTEGRAVIR & RILPIVIRINE ER 600 & 900 MG/3ML IM SUER
1.0000 | Freq: Once | INTRAMUSCULAR | Status: AC
  Administered 2022-07-27: 1 via INTRAMUSCULAR

## 2022-07-27 NOTE — Progress Notes (Signed)
NAME: Becky Lee  DOB: Jan 26, 1946  MRN: [id number]  Date/Time: 07/27/2022 9:01 AM Subjective:   Pt is here for follow up for HIV  and to get HIV iinjection She has been doing very well with injection every 2 months No side effects Doing very well  Last VL < 20 and cd4 is > 600 She started ozempic since her last visit at [facility] Has lost 7 pounds Has stopped her night time cravings But is constipated- is taking some herbal laxative    Following taken from notes before Becky Lee is a 77 y.o. female with a history of HIV, HTN, Morbid obesity, had lap band surgery, port infection, removal of band in 2018, autoimmune hepatitis.  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 Barkley Boards at [facility] for bariatric surgery in [place].   She was diagnosed in 1984 and she states it was secondary to a blood transfusion she had for total abdominal hysterectomy.  She remembers that her nadir CD4 was in single digits.  She was in [place] then.   Her cd4 1118 and Vl < 20 on Sep 07, 2017.. 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 Not sexually active in many years now  AIDS diagnosed 1984 Nadir Cd4 <50 OI - zoster,  HAARt history- many regimens Combivir, Valetta Fuller Descovy + tivicay- DC 04/28/21 Cabenuva 04/28/21  Acquired thru- blood transfusion for TAH in 1984  ? Past Medical History:  Diagnosis Date   HIV (human immunodeficiency virus infection)    Hypertension    morbid obesity Autoimmune hepatitis  In 2008- took steroids and azothiprine Fibroid uterus with mennorhagia 12/19/ 2019- SPECT study FINDINGS: Regional wall motion:  reveals normal  myocardial thickening and wall motion.The overall quality of the study is good.   Left ventricular cavity: normal. Perfusion Analysis:  SPECT images demonstrate homogeneous tracer distribution throughout the myocardium.    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  FH: alcoholism- deceased-brother Liver /lung cancer dad CVA-mother  Allergy-NKDA ? Meds lisinopril MVT ( bariatric advantage multivitamin) Cabenuva   REVIEW OF SYSTEMS:  Const: negative fever, negative chills,lost 6 pounds  Eyes: negative diplopia or visual changes, negative eye pain ENT: has coryza, negative sore throat Broken teeth- got a  new plate Resp: negative cough, hemoptysis, dyspnea Cards: negative for chest pain, palpitations, lower extremity edema GU: negative for frequency, dysuria and hematuria Skin: negative for rash and pruritus Heme: negative for easy bruising and gum/nose bleeding MSK   Neurolo:negative for headaches, dizziness, vertigo, memory problems  Psych: negative for feelings of anxiety, depression   Objective:  VITALS:  BP 96/68   Pulse 88   Temp (!) 97 F (36.1 C) (Temporal)   Ht  (1.651 m)   Wt 210 lb (95.3 kg)   BMI 34.95 kg/m   PHYSICAL EXAM:  General: looks well  HEENT N.  Lungs: Clear to auscultation bilaterally. No Wheezing or Rhonchi. No rales. Heart: Regular rate and rhythm, no murmur, rub or gallop. Abdomen:did not examine today Extremities: Extremities normal, atraumatic, no cyanosis. No edema. No clubbing Skin: No rashes or lesions. Not Jaundiced Lymph: Cervical, supraclavicular normal. Neurologic: Grossly non-focal  Health  maintenance Vaccination  pneumovac- 23-  01/03/18 Prevnar-13-11/30/18 HepB unknown HepA unknown TdaP 07/28/20 Flu this season Herpes zoster-09/12/19-  2nd dose - given - she will get the date MRNA vaccine ( pfizer X3) Flu vaccine 2022 ______________________ labs RPR-NR on Jan 2023 HEPC ab-NR on 05/14/2018 Lipid- 07/25/21 - TC 147,TGL 68, HDL 62, LDL 71, HIV VL<20 Cd4  617 ( 36%) Jan 2023 quantiferon Gold-nonreactive from Jan 2023 [id number] Geno sure archive done on 05/14/2018 does not show significant mutation.  HIV antibody TSH 1.25 ( 06/13/19) ------------------------------------------------------------  Preventive  Dental- followed currently at dental works Colonoscopy- 05/22/19 Opthal cervical pap-not sexually active and has not had it in many years Mammogram-04/02/19  Impression/Recommendation ?77 year old female with history of HIV AIDS diagnosed in the 77s   ? ?HIV AIDS: Last viral load is less than 20 and CD4 is 633  She is on  cabo+rilpavirine im injection . ) will do labs today   Abnormal Lfts on 07/19/21- History of autoimmune hepatitis was on prednisone and azathioprine when she was in [place] but not anymore.  There is a possibility that it was drug-induced hepatitis.( AST 54, ALT 45 and bili 1.6--all normal in April 2023) /HEPB/HEPC/HEPA neg   Leucopenia- chronic and stable -  Hypertension lisinopril by cardiologist Dr.K  History of coronary artery disease diagnosed when she had a work-up for the first lap band surgery SPECT scan done 03/28/18 was normal   Statin -contraindicated because of autoimmune hepatitis and abnormal LFTS  Lipids -very good ( TC 147/ Hdl 62/LDL 71/TGL 147)    Obesity: Had lap band surgery in 2008 and it was removed in 2018 because of the port infection.  On 05/30/2018 she underwent Roux-en-Y bypass She is now on ozempic  Discussed the management with the patient in great detail  Labs today For cabenuva injection today Follow  up in 2months for the next injection

## 2022-08-18 ENCOUNTER — Other Ambulatory Visit (HOSPITAL_COMMUNITY): Payer: Self-pay

## 2022-09-15 ENCOUNTER — Other Ambulatory Visit (HOSPITAL_COMMUNITY): Payer: Self-pay

## 2022-09-18 ENCOUNTER — Other Ambulatory Visit: Payer: Self-pay

## 2022-09-18 ENCOUNTER — Other Ambulatory Visit (HOSPITAL_COMMUNITY): Payer: Self-pay

## 2022-09-26 ENCOUNTER — Encounter: Payer: Self-pay | Admitting: Infectious Diseases

## 2022-09-26 ENCOUNTER — Ambulatory Visit: Payer: Medicare HMO | Attending: Infectious Diseases | Admitting: Infectious Diseases

## 2022-09-26 VITALS — BP 101/72 | HR 70 | Temp 96.4°F | Ht 65.0 in | Wt 195.0 lb

## 2022-09-26 DIAGNOSIS — K754 Autoimmune hepatitis: Secondary | ICD-10-CM | POA: Diagnosis not present

## 2022-09-26 DIAGNOSIS — D72819 Decreased white blood cell count, unspecified: Secondary | ICD-10-CM | POA: Diagnosis not present

## 2022-09-26 DIAGNOSIS — Z9884 Bariatric surgery status: Secondary | ICD-10-CM | POA: Insufficient documentation

## 2022-09-26 DIAGNOSIS — B20 Human immunodeficiency virus [HIV] disease: Secondary | ICD-10-CM | POA: Insufficient documentation

## 2022-09-26 DIAGNOSIS — I10 Essential (primary) hypertension: Secondary | ICD-10-CM | POA: Insufficient documentation

## 2022-09-26 DIAGNOSIS — R7989 Other specified abnormal findings of blood chemistry: Secondary | ICD-10-CM | POA: Insufficient documentation

## 2022-09-26 DIAGNOSIS — Z6832 Body mass index (BMI) 32.0-32.9, adult: Secondary | ICD-10-CM | POA: Insufficient documentation

## 2022-09-26 DIAGNOSIS — I2510 Atherosclerotic heart disease of native coronary artery without angina pectoris: Secondary | ICD-10-CM | POA: Insufficient documentation

## 2022-09-26 DIAGNOSIS — K5900 Constipation, unspecified: Secondary | ICD-10-CM | POA: Insufficient documentation

## 2022-09-26 DIAGNOSIS — Z79624 Long term (current) use of inhibitors of nucleotide synthesis: Secondary | ICD-10-CM | POA: Insufficient documentation

## 2022-09-26 MED ORDER — CABOTEGRAVIR & RILPIVIRINE ER 600 & 900 MG/3ML IM SUER
1.0000 | Freq: Once | INTRAMUSCULAR | Status: AC
  Administered 2022-09-26: 1 via INTRAMUSCULAR

## 2022-09-26 NOTE — Progress Notes (Signed)
NAME: Becky Lee  DOB: May 10, 1945  MRN: [id number]  Date/Time: 09/26/2022 8:47 AM Subjective:   Pt is here for  HIV iinjection She has been doing very well with injection every 2 months No side effects   Last VL < 20 and cd4 is 696 from 05/30/22  She is on ozempic Has lost 21  pounds Has stopped her night time cravings constipated- is taking some herbal laxative    Following taken from notes before Becky Lee is a 77 y.o. female with a history of HIV, HTN, Morbid obesity, had lap band surgery complicated  by , port infection, removal of band in 2018, autoimmune hepatitis.  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 Barkley Boards at [facility] for bariatric surgery in [place].   She was diagnosed in 1984 and she states it was secondary to a blood transfusion she had for total abdominal hysterectomy.  She remembers that her nadir CD4 was in single digits.  She was in [place] then.   Her cd4 1118 and Vl < 20 on Sep 07, 2017.. 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 Not sexually active in many years now  AIDS diagnosed 1984 Nadir Cd4 <50 OI - zoster,  HAARt history- many regimens Combivir, Valetta Fuller Descovy + tivicay- DC 04/28/21 Cabenuva 04/28/21  Acquired thru- blood transfusion for TAH in 1984  ? Past Medical History:  Diagnosis Date   HIV (human immunodeficiency virus infection) (HCC)    Hypertension    morbid obesity Autoimmune hepatitis  In 2008- took steroids and azothiprine Fibroid uterus with mennorhagia 12/19/ 2019- SPECT study FINDINGS: Regional wall motion:  reveals normal myocardial thickening and wall motion.The  overall quality of the study is good.   Left ventricular cavity: normal. Perfusion Analysis:  SPECT images demonstrate homogeneous tracer distribution throughout the myocardium.    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  FH: alcoholism- deceased-brother Liver /lung cancer dad CVA-mother  Allergy-NKDA ? Meds lisinopril MVT ( bariatric advantage multivitamin) Cabenuva   REVIEW OF SYSTEMS:  Const: negative fever, negative chills,lost 6 pounds  Eyes: negative diplopia or visual changes, negative eye pain ENT: has coryza, negative sore throat Broken teeth- got a  new plate Resp: negative cough, hemoptysis, dyspnea Cards: negative for chest pain, palpitations, lower extremity edema GU: negative for frequency, dysuria and hematuria Skin: negative for rash and pruritus Heme: negative for easy bruising and gum/nose bleeding MSK   Neurolo:negative for headaches, dizziness, vertigo, memory problems  Psych: negative for feelings of anxiety, depression   Objective:  VITALS:  BP 101/72   Pulse 70   Temp (!) 96.4 F (35.8 C) (Temporal)   Ht 5\' 5"  (1.651 m)   Wt 195 lb (88.5 kg)   BMI 32.45 kg/m   PHYSICAL EXAM:  General: looks well HEENT N.  Lungs: Clear to auscultation bilaterally. No  Wheezing or Rhonchi. No rales. Heart: Regular rate and rhythm, no murmur, rub or gallop. Abdomen:did not examine today Extremities: Extremities normal, atraumatic, no cyanosis. No edema. No clubbing Skin: No rashes or lesions. Not Jaundiced Lymph: Cervical, supraclavicular normal. Neurologic: Grossly non-focal  Health maintenance Vaccination  pneumovac- 23-   01/03/18 Prevnar-13-11/30/18 HepB unknown HepA unknown TdaP 07/28/20 Flu this season Herpes zoster-09/12/19-  2nd dose - given - she will get the date MRNA vaccine ( pfizer X3) Flu vaccine 2022 ______________________ labs RPR-NR on Jan 2023 HEPC ab-NR on 05/14/2018 Lipid- 07/25/21 - TC 147,TGL 68, HDL 62, LDL 71, HIV VL<20 Cd4  617 ( 36%) Jan 2023 quantiferon Gold-nonreactive from Jan 2023 [id number] Geno sure archive done on 05/14/2018 does not show significant mutation.  HIV antibody TSH 1.25 ( 06/13/19) ------------------------------------------------------------  Preventive  Dental- followed currently at dental works Colonoscopy- 05/22/19 Opthal cervical pap-not sexually active and has not had it in many years Mammogram-04/02/19  Impression/Recommendation ?77 year old female with history of HIV AIDS diagnosed in the 1s   ? ?HIV AIDS: Last viral load is less than 20 and CD4 is 633  She is on  cabo+rilpavirine im injection . ) will do labs next visit   Abnormal Lfts on 07/19/21- History of autoimmune hepatitis was on prednisone and azathioprine when she was in [place] but not anymore.  There is a possibility that it was drug-induced hepatitis.( AST 54, ALT 45 and bili 1.6--all normal in April 2023) /HEPB/HEPC/HEPA neg   Leucopenia- chronic and stable -  Hypertension lisinopril by cardiologist Dr.K  History of coronary artery disease diagnosed when she had a work-up for the first lap band surgery SPECT scan done 03/28/18 was normal   Statin -contraindicated because of autoimmune hepatitis and abnormal LFTS  Lipids -very good ( TC 147/ Hdl 62/LDL 71/TGL 147)    Obesity: Had lap band surgery in 2008 and it was removed in 2018 because of the port infection.  On 05/30/2018 she underwent Roux-en-Y bypass She is now on ozempic  Discussed the management with the patient in great detail  Follow up in 2months for the next injection

## 2022-11-11 ENCOUNTER — Ambulatory Visit
Admission: EM | Admit: 2022-11-11 | Discharge: 2022-11-11 | Disposition: A | Discharge status: Home / Self Care | Payer: Medicare HMO | Attending: Urgent Care | Admitting: Urgent Care

## 2022-11-11 DIAGNOSIS — R509 Fever, unspecified: Secondary | ICD-10-CM

## 2022-11-11 DIAGNOSIS — U071 COVID-19: Secondary | ICD-10-CM

## 2022-11-11 MED ORDER — MOLNUPIRAVIR EUA 200MG CAPSULE: 4.0000 | ORAL_CAPSULE | Freq: Two times a day (BID) | ORAL | 0 refills | Status: AC

## 2022-11-11 MED ORDER — IBUPROFEN 800 MG PO TABS
800.0000 mg | ORAL_TABLET | Freq: Once | ORAL | Status: AC
  Administered 2022-11-11: 800 mg via ORAL

## 2022-11-11 NOTE — ED Triage Notes (Signed)
Patient presents to UC for chills, feer, body aches, facial pain, chest tightness when coughing, and HA since yesterday morning. States she had a positive at home covid test. Treating symptoms with nasal decongestant and nyqui.

## 2022-11-11 NOTE — Discharge Instructions (Addendum)
You are positive for covid. You must quarantine until your symptoms are improving and you are >24 hours fever free. Please read the attached handout. Take 4 capsules of molnupiravir twice daily until gone (5 days). Continue to alternate ibuprofen with tylenol as needed for pain/ fever. OTC oscillococcinum can be helpful for body aches, OTC Quercetin can help with covid symptoms. If any new or worsening symptoms develops, particularly uncontrollable fever, severe shortness of breath or chest pain, please head to the ER.

## 2022-11-11 NOTE — ED Provider Notes (Signed)
[name]    CSN: [id number] Arrival date & time: 11/11/22  1356      History   Chief Complaint Chief Complaint  Patient presents with   Fever   Nasal Congestion    HPI Becky Lee is a 77 y.o. female.   76yo HIV positive pt presents to UC for chills, fever, body aches, facial pain, chest tightness while coughing, and HA since yesterday morning. States she had a positive at home covid test this morning. Treating symptoms with nasal decongestant and nyquil. Last dose several hours ago. Has had covid in the past, took paxlovid at that time and had good response. She was on a different HIV tx at that time however. Pt denies SOB, CP, palpitations. She has not had severe complications to covid in the past and states she is fully vaccinated against it.     Fever   Past Medical History:  Diagnosis Date   HIV (human immunodeficiency virus infection) (HCC)    Hypertension     Patient Active Problem List   Diagnosis Date Noted   Gastritis without bleeding 03/31/2019   Hiatal hernia 04/24/2018   Bradycardia 03/29/2018   Gallstones 01/09/2018   Vitamin D deficiency 12/19/2017   Morbid obesity with BMI of 40.0-44.9, adult (HCC) 05/09/2017   Hypertension 05/23/2016   Other specified bacterial agents as the cause of diseases classified elsewhere 05/01/2016   Hepatitis, autoimmune (HCC) 04/28/2016   Human immunodeficiency virus I infection (HCC) 04/28/2016   Wound infection complicating hardware (HCC) 04/28/2016   Postoperative infection 04/27/2016   Bariatric surgery status 04/24/2016   Benign essential HTN 09/28/2015   CAD (coronary artery disease) 09/28/2015   Mixed hyperlipidemia 09/28/2015    Past Surgical History:  Procedure Laterality Date   BREAST EXCISIONAL BIOPSY Left    neg   lap band surgery      OB History   No obstetric history on file.      Home Medications    Prior to Admission medications   Medication Sig Start Date End Date Taking?  Authorizing Provider  molnupiravir EUA (LAGEVRIO) 200 mg CAPS capsule Take 4 capsules (800 mg total) by mouth 2 (two) times daily for 5 days. 11/11/22 11/16/22 Yes Cohen Boettner L, PA  cabotegravir & rilpivirine ER (CABENUVA) 600 & 900 MG/3ML injection Inject 1 kit into the muscle every 30 (thirty) days. 04/26/21   Kuppelweiser, Cassie L, RPH-CPP  cabotegravir & rilpivirine ER (CABENUVA) 600 & 900 MG/3ML injection Inject 1 kit into the muscle every 2 (two) months. 05/15/22   Kuppelweiser, Cassie L, RPH-CPP  lisinopril (ZESTRIL) 10 MG tablet Take 1 tablet by mouth daily. 08/20/20 08/20/21  [provider]  Semaglutide-Weight Management 0.5 MG/0.5ML SOAJ Inject 0.5 mg into the skin once a week.    [provider]    Family History History reviewed. No pertinent family history.  Social History Social History   Tobacco Use   Smoking status: Never   Smokeless tobacco: Never  Substance Use Topics   Alcohol use: Yes   Drug use: No     Allergies   Patient has no known allergies.   Review of Systems Review of Systems  Constitutional:  Positive for fever.  As per HPI   Physical Exam Triage Vital Signs ED Triage Vitals [11/11/22 1408]  Encounter Vitals Group     BP 113/81     Systolic BP Percentile      Diastolic BP Percentile      Pulse Rate Marland Kitchen)  113     Resp 16     Temp (!) 101.4 F (38.6 C)     Temp Source Temporal     SpO2 97 %     Weight      Height      Head Circumference      Peak Flow      Pain Score      Pain Loc      Pain Education      Exclude from Growth Chart    No data found.  Updated Vital Signs BP 113/81 (BP Location: Left Arm)   Pulse (!) 113   Temp (!) 100.6 F (38.1 C) (Oral)   Resp 16   SpO2 97%   Visual Acuity Right Eye Distance:   Left Eye Distance:   Bilateral Distance:    Right Eye Near:   Left Eye Near:    Bilateral Near:     Physical Exam Vitals and nursing note reviewed.  Constitutional:      General: She is not in  acute distress.    Appearance: Normal appearance. She is well-developed. She is obese. She is not ill-appearing, toxic-appearing or diaphoretic.  HENT:     Head: Normocephalic and atraumatic.     Right Ear: Tympanic membrane, ear canal and external ear normal. There is no impacted cerumen.     Left Ear: Tympanic membrane, ear canal and external ear normal. There is no impacted cerumen.     Nose: Nose normal. No congestion or rhinorrhea.     Mouth/Throat:     Mouth: Mucous membranes are moist.     Pharynx: Oropharynx is clear. No oropharyngeal exudate or posterior oropharyngeal erythema.  Eyes:     General: No scleral icterus.       Right eye: No discharge.        Left eye: No discharge.     Extraocular Movements: Extraocular movements intact.     Conjunctiva/sclera: Conjunctivae normal.     Pupils: Pupils are equal, round, and reactive to light.  Cardiovascular:     Rate and Rhythm: Regular rhythm. Tachycardia present.     Pulses: Normal pulses.     Heart sounds: Normal heart sounds.  Pulmonary:     Effort: Pulmonary effort is normal. No respiratory distress.     Breath sounds: Normal breath sounds. No stridor. No wheezing, rhonchi or rales.  Chest:     Chest wall: No tenderness.  Abdominal:     Palpations: Abdomen is soft.     Tenderness: There is no abdominal tenderness.  Musculoskeletal:        General: No swelling or tenderness. Normal range of motion.     Cervical back: Normal range of motion and neck supple. No tenderness.     Right lower leg: No edema.     Left lower leg: No edema.  Lymphadenopathy:     Cervical: No cervical adenopathy.  Skin:    General: Skin is warm and dry.     Coloration: Skin is not jaundiced.     Findings: No rash.  Neurological:     General: No focal deficit present.     Mental Status: She is alert and oriented to person, place, and time.  Psychiatric:        Mood and Affect: Mood normal.      UC Treatments / Results  Labs (all labs  ordered are listed, but only abnormal results are displayed) Labs Reviewed - No data to display  EKG  Radiology No results found.  Procedures Procedures (including critical care time)  Medications Ordered in UC Medications  ibuprofen (ADVIL) tablet 800 mg (800 mg Oral Given 11/11/22 1444)    Initial Impression / Assessment and Plan / UC Course  I have reviewed the triage vital signs and the nursing notes.  Pertinent labs & imaging results that were available during my care of the patient were reviewed by me and considered in my medical decision making (see chart for details).     Covid-19 - sx consistent with covid-19, pt with positive home test. Pt took paxlovid in the past, however was on a different HIV med at that time. Unfortunately, paxlovid is not advised to be taken concurrently with Cabenuva. Pt's last injection was 5 weeks ago. Called in [place], which unfortunately requires prior auth. Tried to call to get prior auth as paxlovid contraindicated, however they were closed. Will try again on Monday. Pt will see if [facility] can get it approved with given information. An alternative would be ER for possible infusion? Pt stated feeling much improved after ibuprofen. Fever - pt to take 1000mg  tylenol alternating every 4 hours with 800mg  ibuprofen.    Final Clinical Impressions(s) / UC Diagnoses   Final diagnoses:  COVID-19  Fever, unspecified     Discharge Instructions      You are positive for covid. You must quarantine until your symptoms are improving and you are >24 hours fever free. Please read the attached handout. Take 4 capsules of molnupiravir twice daily until gone (5 days). Continue to alternate ibuprofen with tylenol as needed for pain/ fever. OTC oscillococcinum can be helpful for body aches, OTC Quercetin can help with covid symptoms. If any new or worsening symptoms develops, particularly uncontrollable fever, severe shortness of breath or chest  pain, please head to the ER.      ED Prescriptions     Medication Sig Dispense Auth. Provider   molnupiravir EUA (LAGEVRIO) 200 mg CAPS capsule Take 4 capsules (800 mg total) by mouth 2 (two) times daily for 5 days. 40 capsule Valrie Jia L, PA      PDMP not reviewed this encounter.   Maretta Bees, [place] 11/11/22 1600

## 2022-11-14 ENCOUNTER — Other Ambulatory Visit (HOSPITAL_COMMUNITY): Payer: Self-pay

## 2022-11-20 ENCOUNTER — Other Ambulatory Visit (HOSPITAL_COMMUNITY): Payer: Self-pay

## 2022-11-28 ENCOUNTER — Other Ambulatory Visit
Admission: RE | Admit: 2022-11-28 | Discharge: 2022-11-28 | Disposition: A | Discharge status: Home / Self Care | Payer: Medicare HMO | Source: Ambulatory Visit | Attending: Infectious Diseases | Admitting: Infectious Diseases

## 2022-11-28 ENCOUNTER — Other Ambulatory Visit: Payer: Self-pay

## 2022-11-28 ENCOUNTER — Ambulatory Visit: Payer: Medicare HMO

## 2022-11-28 VITALS — BP 114/79 | HR 66 | Temp 96.6°F | Wt 189.0 lb

## 2022-11-28 DIAGNOSIS — B20 Human immunodeficiency virus [HIV] disease: Secondary | ICD-10-CM | POA: Insufficient documentation

## 2022-11-28 LAB — CBC WITH DIFFERENTIAL/PLATELET
Abs Immature Granulocytes: 0.03 10*3/uL (ref 0.00–0.07)
Basophils Absolute: 0 10*3/uL (ref 0.0–0.1)
Basophils Relative: 1 %
Eosinophils Absolute: 0 10*3/uL (ref 0.0–0.5)
Eosinophils Relative: 1 %
HCT: 36 % (ref 36.0–46.0)
Hemoglobin: 12.5 g/dL (ref 12.0–15.0)
Immature Granulocytes: 1 %
Lymphocytes Relative: 28 %
Lymphs Abs: 1.2 10*3/uL (ref 0.7–4.0)
MCH: 29.1 pg (ref 26.0–34.0)
MCHC: 34.7 g/dL (ref 30.0–36.0)
MCV: 83.9 fL (ref 80.0–100.0)
Monocytes Absolute: 0.2 10*3/uL (ref 0.1–1.0)
Monocytes Relative: 4 %
Neutro Abs: 2.9 10*3/uL (ref 1.7–7.7)
Neutrophils Relative %: 65 %
Platelets: 213 10*3/uL (ref 150–400)
RBC: 4.29 MIL/uL (ref 3.87–5.11)
RDW: 14.1 % (ref 11.5–15.5)
WBC: 4.4 10*3/uL (ref 4.0–10.5)
nRBC: 0 % (ref 0.0–0.2)

## 2022-11-28 LAB — COMPREHENSIVE METABOLIC PANEL WITH GFR
ALT: 20 U/L (ref 0–44)
AST: 28 U/L (ref 15–41)
Albumin: 3.6 g/dL (ref 3.5–5.0)
Alkaline Phosphatase: 76 U/L (ref 38–126)
Anion gap: 8 (ref 5–15)
BUN: 18 mg/dL (ref 8–23)
CO2: 26 mmol/L (ref 22–32)
Calcium: 9.4 mg/dL (ref 8.9–10.3)
Chloride: 105 mmol/L (ref 98–111)
Creatinine, Ser: 0.86 mg/dL (ref 0.44–1.00)
GFR, Estimated: >60 mL/min
Glucose, Bld: 88 mg/dL (ref 70–99)
Potassium: 4.6 mmol/L (ref 3.5–5.1)
Sodium: 139 mmol/L (ref 135–145)
Total Bilirubin: 1.6 mg/dL — ABNORMAL HIGH (ref 0.3–1.2)
Total Protein: 7.7 g/dL (ref 6.5–8.1)

## 2022-11-28 MED ORDER — CABOTEGRAVIR & RILPIVIRINE ER 600 & 900 MG/3ML IM SUER
1.0000 | Freq: Once | INTRAMUSCULAR | Status: AC
  Administered 2022-11-28: 1 via INTRAMUSCULAR

## 2022-11-29 LAB — HIV-1 RNA QUANT-NO REFLEX-BLD
HIV 1 RNA Quant: <20 {copies}/mL
LOG10 HIV-1 RNA: UNDETERMINED {Log_copies}/mL

## 2022-11-30 LAB — T-HELPER CELLS CD4/CD8 %
% CD 4 Pos. Lymph.: 37.9 % (ref 30.8–58.5)
Absolute CD 4 Helper: 531 /uL (ref 359–1519)
Basophils Absolute: 0 10*3/uL (ref 0.0–0.2)
Basos: 1 %
CD3+CD4+ Cells/CD3+CD8+ Cells Bld: 1.81 (ref 0.92–3.72)
CD3+CD8+ Cells # Bld: 293 /uL (ref 109–897)
CD3+CD8+ Cells NFr Bld: 20.9 % (ref 12.0–35.5)
EOS (ABSOLUTE): 0 10*3/uL (ref 0.0–0.4)
Eos: 1 %
Hematocrit: 37.4 % (ref 34.0–46.6)
Hemoglobin: 12.6 g/dL (ref 11.1–15.9)
Immature Grans (Abs): 0 10*3/uL (ref 0.0–0.1)
Immature Granulocytes: 0 %
Lymphocytes Absolute: 1.4 10*3/uL (ref 0.7–3.1)
Lymphs: 38 %
MCH: 29 pg (ref 26.6–33.0)
MCHC: 33.7 g/dL (ref 31.5–35.7)
MCV: 86 fL (ref 79–97)
Monocytes Absolute: 0.3 10*3/uL (ref 0.1–0.9)
Monocytes: 9 %
Neutrophils Absolute: 1.8 10*3/uL (ref 1.4–7.0)
Neutrophils: 51 %
Platelets: 224 10*3/uL (ref 150–450)
RBC: 4.34 x10E6/uL (ref 3.77–5.28)
RDW: 14 % (ref 11.7–15.4)
WBC: 3.6 10*3/uL (ref 3.4–10.8)

## 2022-12-01 ENCOUNTER — Telehealth: Payer: Self-pay

## 2022-12-01 NOTE — Telephone Encounter (Signed)
-----   Message from Lynn Ito sent at 11/30/2022  2:34 PM EDT ----- Her VL is undetectable. Please let her know. Thx ----- Message ----- From: Leory Plowman, [facility] [place] Sent: 11/28/2022  12:37 PM EDT To: Lynn Ito, MD

## 2022-12-01 NOTE — Telephone Encounter (Signed)
Patient informed of lab results and verbalized understanding.  [name]  

## 2023-01-11 ENCOUNTER — Other Ambulatory Visit: Payer: Self-pay

## 2023-01-11 ENCOUNTER — Other Ambulatory Visit (HOSPITAL_COMMUNITY): Payer: Self-pay

## 2023-01-11 NOTE — Progress Notes (Signed)
Specialty Pharmacy Refill Coordination Note  [name] is a 76 y.o. female assessed today regarding refills of clinic administered specialty medication(s) Cabotegravir & Rilpivirine   Clinic requested Courier to Provider Office   Delivery date: 01/16/23   Verified address: [facility] [address] [place] [address]   Medication will be filled on 01/15/23.

## 2023-01-24 ENCOUNTER — Other Ambulatory Visit (HOSPITAL_COMMUNITY): Payer: Self-pay

## 2023-01-25 ENCOUNTER — Ambulatory Visit: Payer: Medicare HMO | Attending: Infectious Diseases | Admitting: Infectious Diseases

## 2023-01-25 ENCOUNTER — Encounter: Payer: Self-pay | Admitting: Infectious Diseases

## 2023-01-25 VITALS — BP 121/86 | HR 61 | Temp 97.2°F | Ht 65.0 in | Wt 181.0 lb

## 2023-01-25 DIAGNOSIS — Z9884 Bariatric surgery status: Secondary | ICD-10-CM | POA: Diagnosis not present

## 2023-01-25 DIAGNOSIS — Z6830 Body mass index (BMI) 30.0-30.9, adult: Secondary | ICD-10-CM | POA: Insufficient documentation

## 2023-01-25 DIAGNOSIS — H04212 Epiphora due to excess lacrimation, left lacrimal gland: Secondary | ICD-10-CM | POA: Insufficient documentation

## 2023-01-25 DIAGNOSIS — Z8616 Personal history of COVID-19: Secondary | ICD-10-CM | POA: Diagnosis present

## 2023-01-25 DIAGNOSIS — B20 Human immunodeficiency virus [HIV] disease: Secondary | ICD-10-CM | POA: Diagnosis not present

## 2023-01-25 DIAGNOSIS — Z21 Asymptomatic human immunodeficiency virus [HIV] infection status: Secondary | ICD-10-CM | POA: Diagnosis present

## 2023-01-25 DIAGNOSIS — Z23 Encounter for immunization: Secondary | ICD-10-CM | POA: Insufficient documentation

## 2023-01-25 DIAGNOSIS — I2510 Atherosclerotic heart disease of native coronary artery without angina pectoris: Secondary | ICD-10-CM | POA: Diagnosis not present

## 2023-01-25 DIAGNOSIS — Z79899 Other long term (current) drug therapy: Secondary | ICD-10-CM | POA: Insufficient documentation

## 2023-01-25 DIAGNOSIS — I10 Essential (primary) hypertension: Secondary | ICD-10-CM | POA: Diagnosis not present

## 2023-01-25 DIAGNOSIS — D72819 Decreased white blood cell count, unspecified: Secondary | ICD-10-CM | POA: Diagnosis not present

## 2023-01-25 DIAGNOSIS — K754 Autoimmune hepatitis: Secondary | ICD-10-CM | POA: Insufficient documentation

## 2023-01-25 MED ORDER — CABOTEGRAVIR & RILPIVIRINE ER 600 & 900 MG/3ML IM SUER
1.0000 | Freq: Once | INTRAMUSCULAR | Status: AC
Start: 2023-01-25 — End: 2023-01-25
  Administered 2023-01-25: 1 via INTRAMUSCULAR

## 2023-01-25 NOTE — Patient Instructions (Signed)
VISIT SUMMARY:  During your visit, we discussed your recent recovery from a mild case of COVID-19, your ongoing management of HIV and autoimmune hepatitis, and your eye irritation. We also reviewed your general health maintenance, including your significant weight loss and stable liver function.  YOUR PLAN:  -HIV: Your HIV is stable with undetectable viral load, which means the amount of HIV in your blood is very low. We will continue with your current treatment, Cabenuva injections.  -COVID-19: You recently recovered from a mild COVID-19 infection. We plan to give you a COVID-19 booster vaccine 90 days after your infection, which will be in December 2024.  -EYE IRRITATION: You've been experiencing spontaneous tearing from your left eye. Your eye doctor found no blockage and recommended you continue using Systane drops.  INSTRUCTIONS:  Today, we administered your Cabenuva injection, as well as your flu and Pneumovax vaccines. follow up with me in 4 months

## 2023-01-25 NOTE — Progress Notes (Signed)
NAME: Becky Lee  DOB: [birth year]  MRN: [id number]  Date/Time: 01/25/2023 9:39 AM Subjective:   Pt is here for  HIV   The patient, with a history of HIV and autoimmune hepatitis, recently recovered from a mild case of COVID-19. She was not given Paxlovid due to a contraindication with HAART  The patient managed her symptoms with Mucinex and Nyquil, and her fever broke after taking 800mg  of ibuprofen. She reports that her symptoms were subtle and insignificant.  The patient's T cell count has been fluctuating, with recent counts being lower than previous years. However, the patient's viral load remains undetectable, which is a positive outcome. The patient also reports a significant weight loss, from 210lbs to 181lbs, since starting Ozempic.  The patient also has a history of autoimmune hepatitis, which is being monitored. Her liver function tests have been stable, and there is no current concern about liver disease. The patient also reports a problem with her eye, which leaks water at will. She has seen an eye doctor for this issue and is using Systane drops for management.    Following taken from notes before Becky Lee is a 77 y.o. female with a history of HIV, HTN, Morbid obesity, had lap band surgery complicated  by , port infection, removal of band in 2018, autoimmune hepatitis.  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 Barkley Boards at [facility] for bariatric surgery in [place].   She was diagnosed in 1984 and she states it was secondary to a blood transfusion she had for total abdominal hysterectomy.  She remembers that her nadir CD4 was in single digits.  She was in [place] then.   Her cd4 1118 and Vl < 20 on Sep 07, 2017.. 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 Not sexually active in many years now  AIDS diagnosed 1984 Nadir Cd4 <50 OI - zoster,  HAARt history- many regimens Combivir, Valetta Fuller Descovy + tivicay- DC 04/28/21 Cabenuva 04/28/21  Acquired thru- blood transfusion for TAH in 1984  ? Past Medical History:  Diagnosis Date   HIV (human immunodeficiency virus infection) (HCC)    Hypertension    morbid obesity Autoimmune hepatitis  In 2008- took steroids and azothiprine Fibroid uterus with mennorhagia 12/19/ 2019- SPECT study FINDINGS: Regional wall motion:  reveals normal myocardial thickening and wall motion.The overall quality of the study is good.   Left ventricular cavity: normal. Perfusion Analysis:  SPECT images demonstrate homogeneous tracer distribution throughout the myocardium.    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  FH: alcoholism- deceased-brother Liver /lung cancer dad CVA-mother  Allergy-NKDA ? Meds lisinopril MVT ( bariatric advantage multivitamin) Cabenuva   REVIEW OF SYSTEMS:  Const: negative fever, negative chills,lost 6 pounds  Eyes: negative diplopia or visual changes, negative eye pain ENT: has coryza, negative sore throat Broken teeth- got  a  new plate Resp: negative cough, hemoptysis, dyspnea Cards: negative for chest pain, palpitations, lower extremity edema GU: negative for frequency, dysuria and hematuria Skin: negative for rash and pruritus Heme: negative for easy bruising and gum/nose bleeding MSK   Neurolo:negative for  headaches, dizziness, vertigo, memory problems  Psych: negative for feelings of anxiety, depression   Objective:  VITALS:  BP 121/86   Pulse 61   Temp (!) 97.2 F (36.2 C) (Temporal)   Ht 5\' 5"  (1.651 m)   Wt 181 lb (82.1 kg)   BMI 30.12 kg/m   PHYSICAL EXAM:  General: looks well HEENT N.  Lungs: Clear to auscultation bilaterally. No Wheezing or Rhonchi. No rales. Heart: Regular rate and rhythm, no murmur, rub or gallop. Abdomen:did not examine today Extremities: Extremities normal, atraumatic, no cyanosis. No edema. No clubbing Skin: No rashes or lesions. Not Jaundiced Lymph: Cervical, supraclavicular normal. Neurologic: Grossly non-focal  Health maintenance Vaccination  pneumovac- 23-  01/03/18 Prevnar-13-11/30/18 HepB unknown HepA unknown TdaP 07/28/20 Flu this season Herpes zoster-09/12/19-  2nd dose - given - she will get the date MRNA vaccine ( pfizer X3) Flu vaccine 2022 ______________________ labs RPR-NR on Jan 2023 HEPC ab-NR on 05/14/2018 Lipid- 07/25/21 - TC 147,TGL 68, HDL 62, LDL 71, HIV VL<20 Cd4  617 ( 36%) Jan 2023 quantiferon Gold-nonreactive from Jan 2023 [id number] Geno sure archive done on 05/14/2018 does not show significant mutation.  HIV antibody TSH 1.25 ( 06/13/19) ------------------------------------------------------------  Preventive  Dental- followed currently at dental works Colonoscopy- 05/22/19 Opthal cervical pap-not sexually active and has not had it in many years Mammogram-02/08/22  LABS T cell count: 531 (11/2022) T cell percentage: 38.6 (2020) T cell percentage: 31.8 (2020) T cell percentage: 37.9 T cell percentage: 46 T cell percentage: 47 WBC: low Viral load: undetectable Total cholesterol: 133 (08/2022) HDL: 55 (08/2022) LDL: 68 (08/2022) Bilirubin: 1.6 (11/2022)  Impression/Recommendation ?77 year old female with history of HIV AIDS diagnosed in the 75s   ? ?HIV   Stable on Cabenuva with undetectable viral load. CD4  count and percentage within acceptable range despite patient's perception of decrease since medication change.   -Continue Cabenuva injections.   -Administer Cabenuva injection today.    COVID-19   Recent mild infection managed at home with symptomatic treatment.   -Plan to administer COVID-19 booster vaccine 90 days post-infection (December 2024).    Eye Irritation   Complaint of spontaneous tearing from left eye. Eye doctor has evaluated and found no blockage.   -Continue Systane drops as prescribed by eye doctor.     Abnormal Lfts on 07/19/21- History of autoimmune hepatitis was on prednisone and azathioprine when she was in [place] but not anymore.  There is a possibility that it was drug-induced hepatitis.( AST 54, ALT 45 and bili 1.6--all normal in April 2023) /HEPB/HEPC/HEPA neg   Leucopenia- chronic and stable -  Hypertension lisinopril by cardiologist Dr.K  History of coronary artery disease diagnosed when she had a work-up for the first lap band surgery SPECT scan done 03/28/18 was normal   Statin -contraindicated because of autoimmune hepatitis and abnormal LFTS  Lipids -very good ( TC 147/ Hdl 62/LDL 71/TGL 147)    Obesity: Had lap band surgery in 2008 and it was removed in 2018 because of the port infection.  On 05/30/2018 she underwent Roux-en-Y bypass She is now on ozempic  Discussed the management with the patient in great detail General Health Maintenance   -Administer flu and Pneumovax vaccines today.   -  Continue monitoring cholesterol, A1C, liver function, and kidney function as she has been within normal limits.   -Encourage continued healthy diet and weight loss (weight down from 210 to 181 since April 2024).  Follow up in 2months for the next injection

## 2023-02-14 ENCOUNTER — Other Ambulatory Visit: Payer: Self-pay

## 2023-03-12 ENCOUNTER — Other Ambulatory Visit: Payer: Self-pay

## 2023-03-12 ENCOUNTER — Other Ambulatory Visit (HOSPITAL_COMMUNITY): Payer: Self-pay

## 2023-03-12 NOTE — Progress Notes (Signed)
Specialty Pharmacy Refill Coordination Note  Becky Lee is a 77 y.o. female assessed today regarding refills of clinic administered specialty medication(s) Cabotegravir & Rilpivirine   Clinic requested Courier to Provider Office   Delivery date: 03/22/23   Verified address: [address] [place] [address]   Medication will be filled on 03/21/23.

## 2023-03-14 ENCOUNTER — Other Ambulatory Visit (HOSPITAL_COMMUNITY): Payer: Self-pay

## 2023-03-21 ENCOUNTER — Other Ambulatory Visit: Payer: Self-pay

## 2023-03-27 ENCOUNTER — Ambulatory Visit: Payer: Medicare HMO | Attending: Infectious Diseases | Admitting: Infectious Diseases

## 2023-03-27 ENCOUNTER — Encounter: Payer: Self-pay | Admitting: Infectious Diseases

## 2023-03-27 VITALS — BP 107/76 | HR 65 | Temp 97.9°F | Wt 175.0 lb

## 2023-03-27 DIAGNOSIS — Z79624 Long term (current) use of inhibitors of nucleotide synthesis: Secondary | ICD-10-CM | POA: Diagnosis not present

## 2023-03-27 DIAGNOSIS — R7989 Other specified abnormal findings of blood chemistry: Secondary | ICD-10-CM | POA: Diagnosis not present

## 2023-03-27 DIAGNOSIS — E669 Obesity, unspecified: Secondary | ICD-10-CM | POA: Insufficient documentation

## 2023-03-27 DIAGNOSIS — D72819 Decreased white blood cell count, unspecified: Secondary | ICD-10-CM | POA: Diagnosis not present

## 2023-03-27 DIAGNOSIS — B20 Human immunodeficiency virus [HIV] disease: Secondary | ICD-10-CM | POA: Insufficient documentation

## 2023-03-27 DIAGNOSIS — Z9884 Bariatric surgery status: Secondary | ICD-10-CM | POA: Insufficient documentation

## 2023-03-27 DIAGNOSIS — I2510 Atherosclerotic heart disease of native coronary artery without angina pectoris: Secondary | ICD-10-CM | POA: Diagnosis not present

## 2023-03-27 DIAGNOSIS — I10 Essential (primary) hypertension: Secondary | ICD-10-CM | POA: Diagnosis not present

## 2023-03-27 DIAGNOSIS — K754 Autoimmune hepatitis: Secondary | ICD-10-CM | POA: Diagnosis not present

## 2023-03-27 DIAGNOSIS — Z6829 Body mass index (BMI) 29.0-29.9, adult: Secondary | ICD-10-CM | POA: Diagnosis not present

## 2023-03-27 MED ORDER — CABOTEGRAVIR & RILPIVIRINE ER 600 & 900 MG/3ML IM SUER
1.0000 | Freq: Once | INTRAMUSCULAR | Status: AC
Start: 2023-03-27 — End: 2023-03-27
  Administered 2023-03-27: 1 via INTRAMUSCULAR

## 2023-03-27 NOTE — Addendum Note (Signed)
Addended by: Juanita Laster on: 03/27/2023 09:08 AM   Modules accepted: Orders

## 2023-03-27 NOTE — Progress Notes (Signed)
NAME: Becky Lee  DOB: May 04, 1945  MRN: [id number]  Date/Time: 03/27/2023 8:46 AM Subjective:   Pt is here for  HIV care Doing well- takes cabenuva injections Vl < 20 and cd4 was 531 on 11/28/22 Since her last visit the lisinopril dose has been reduced to 5 mg once a day  Becky Lee is a 77 y.o. female with a history of HIV, HTN, Morbid obesity, had lap band surgery complicated  by , port infection, removal of band in 2018, autoimmune hepatitis.  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 Barkley Boards at [facility] for bariatric surgery in [place].   She was diagnosed in 1984 and she states it was secondary to a blood transfusion she had for total abdominal hysterectomy.  She remembers that her nadir CD4 was in single digits.  She was in [place] then.   Her cd4 1118 and Vl < 20 on Sep 07, 2017.. 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 Not sexually active in many years now  AIDS diagnosed 1984 Nadir Cd4 <50 OI - zoster,  HAARt history- many regimens Combivir, Valetta Fuller Descovy + tivicay- DC 04/28/21 Cabenuva 04/28/21  Acquired thru- blood transfusion for TAH in 1984  ? Past Medical History:  Diagnosis Date   HIV (human immunodeficiency virus infection) (HCC)    Hypertension    morbid obesity Autoimmune hepatitis  In 2008- took steroids and azothiprine Fibroid uterus with mennorhagia 12/19/ 2019- SPECT study FINDINGS: Regional wall motion:  reveals normal myocardial thickening and wall motion.The overall quality of the study is good.   Left ventricular cavity: normal. Perfusion Analysis:  SPECT images demonstrate homogeneous tracer  distribution throughout the myocardium.    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  FH: alcoholism- deceased-brother Liver /lung cancer dad CVA-mother  Allergy-NKDA ? Meds lisinopril MVT ( bariatric advantage multivitamin) Cabenuva   REVIEW OF SYSTEMS:  Const: negative fever, negative chills,lost 45 pounds since starting ozempic April 2024  Eyes: negative diplopia or visual changes, negative eye pain ENT: has coryza, negative sore throat Broken teeth- got a  new plate Resp: negative cough, hemoptysis, dyspnea Cards: negative for chest pain, palpitations, lower extremity edema GU: negative for frequency, dysuria and hematuria Skin: negative for rash and pruritus Heme: negative for easy bruising and gum/nose bleeding MSK   Neurolo:negative for headaches, dizziness, vertigo, memory problems  Psych: negative for feelings of anxiety, depression   Objective:  VITALS:  BP 107/76   Pulse 65   Temp 97.9 F (36.6 C) (Oral)   Wt 175 lb (79.4 kg)   BMI 29.12 kg/m   PHYSICAL EXAM:  General: looks well HEENT N.  Lungs: Clear to auscultation bilaterally. No Wheezing or Rhonchi. No rales. Heart: Regular rate and rhythm, no murmur, rub or gallop. Abdomen:did not examine today Extremities: Extremities normal, atraumatic, no cyanosis. No edema. No  clubbing Skin: No rashes or lesions. Not Jaundiced Lymph: Cervical, supraclavicular normal. Neurologic: Grossly non-focal  Health maintenance Vaccination  pneumovac- 23-  01/03/18 Prevnar-13-11/30/18 HepB unknown HepA unknown TdaP 07/28/20 Flu this season Herpes zoster-09/12/19-  2nd dose - given - she will get  the date MRNA vaccine ( pfizer X3) Flu vaccine 2022 ______________________ labs RPR-NR on Jan 2023 HEPC ab-NR on 05/14/2018 Lipid- 07/25/21 - TC 147,TGL 68, HDL 62, LDL 71, HIV VL<20 Cd4  617 ( 36%) Jan 2023 quantiferon Gold-nonreactive from Jan 2023 [id number] Geno sure archive done on 05/14/2018 does not show significant mutation.  HIV antibody TSH 1.25 ( 06/13/19) ------------------------------------------------------------  Preventive  Dental- followed currently at [facility] Colonoscopy- 05/22/19 Opthal cervical pap-not sexually active and has not had it in many years Mammogram-02/08/22  LABS T cell count: 531 (11/2022) T cell percentage: 38.6 (2020) T cell percentage: 31.8 (2020) T cell percentage: 37.9 T cell percentage: 46 T cell percentage: 47 WBC: low Viral load: undetectable Total cholesterol: 133 (08/2022) HDL: 55 (08/2022) LDL: 68 (08/2022) Bilirubin: 1.6 (11/2022)  Impression/Recommendation ?77 year old female with history of HIV AIDS diagnosed in the 69s   ? ?HIV   Stable on Cabenuva with undetectable viral load. CD4 count and percentage within acceptable range despite patient's perception of decrease since medication change.   -Continue Cabenuva injections.   -Administer Cabenuva injection today.      Eye dryness  Complaint of spontaneous tearing from left eye. Eye doctor has evaluated and found no blockage.   -Continue Systane drops as prescribed by eye doctor.     Abnormal Lfts on 07/19/21- History of autoimmune hepatitis was on prednisone and azathioprine when she was in [place] but not anymore.  There is a possibility that it was drug-induced hepatitis.( AST 54, ALT 45 and bili 1.6--all normal in April 2023) /HEPB/HEPC/HEPA neg   Leucopenia- chronic and stable -  Hypertension well controlled  lisinopril by cardiologist Dr.K Reduced to 5 mg   History of coronary artery disease diagnosed when she had a work-up for the first lap band  surgery SPECT scan done 03/28/18 was normal   Statin -contraindicated because of autoimmune hepatitis and abnormal LFTS  Lipids -very good ( TC 147/ Hdl 62/LDL 71/TGL 147)    Obesity: Had lap band surgery in 2008 and it was removed in 2018 because of the port infection.  On 05/30/2018 she underwent Roux-en-Y bypass She is now on ozempic  Discussed the management with the patient in great detail  Follow up in 2months for the next injection Will do labs next visit

## 2023-04-23 ENCOUNTER — Other Ambulatory Visit (INDEPENDENT_AMBULATORY_CARE_PROVIDER_SITE_OTHER): Payer: Self-pay | Admitting: Nurse Practitioner

## 2023-04-23 DIAGNOSIS — I83892 Varicose veins of left lower extremities with other complications: Secondary | ICD-10-CM

## 2023-04-26 ENCOUNTER — Ambulatory Visit (INDEPENDENT_AMBULATORY_CARE_PROVIDER_SITE_OTHER): Payer: Medicare HMO | Admitting: Nurse Practitioner

## 2023-04-26 ENCOUNTER — Ambulatory Visit (INDEPENDENT_AMBULATORY_CARE_PROVIDER_SITE_OTHER): Payer: Medicare HMO

## 2023-04-26 ENCOUNTER — Encounter (INDEPENDENT_AMBULATORY_CARE_PROVIDER_SITE_OTHER): Payer: Self-pay | Admitting: Nurse Practitioner

## 2023-04-26 VITALS — BP 118/79 | HR 47 | Resp 16 | Ht 65.0 in | Wt 170.8 lb

## 2023-04-26 DIAGNOSIS — I10 Essential (primary) hypertension: Secondary | ICD-10-CM

## 2023-04-26 DIAGNOSIS — I83892 Varicose veins of left lower extremities with other complications: Secondary | ICD-10-CM

## 2023-04-26 DIAGNOSIS — E782 Mixed hyperlipidemia: Secondary | ICD-10-CM

## 2023-04-26 DIAGNOSIS — I83819 Varicose veins of unspecified lower extremities with pain: Secondary | ICD-10-CM | POA: Diagnosis not present

## 2023-04-26 NOTE — Progress Notes (Signed)
Subjective:    Patient ID: Becky Lee, female    DOB: 1945/04/26, 78 y.o.   MRN: [id number] Chief Complaint  Patient presents with   New Patient (Initial Visit)    NP. reflux/consult. symptomatic LLE VV. hande    The patient is seen for evaluation of symptomatic varicose veins of left lower extremity.  Initially began as a very sore spot and some discoloration behind her medial ankle.  It has slowly worsened and varicose veins have enlarged and become more prominent up her calf area.  The patient relates burning and stinging which worsened steadily throughout the course of the day, particularly with standing. The patient also notes an aching and throbbing pain over the varicosities, particularly with prolonged dependent positions. The symptoms are significantly improved with elevation.  The patient also notes that during hot weather the symptoms are greatly intensified. The patient states the pain from the varicose veins interferes with work, daily exercise, shopping and household maintenance.  There is no history of DVT, PE or superficial thrombophlebitis. There is no history of ulceration or hemorrhage. The patient denies a significant family history of varicose veins.  The patient has not worn graduated compression in the past. At the present time the patient has not been using over-the-counter analgesics. There is no history of prior surgical intervention or sclerotherapy.  Today no DVT or superficial phlebitis is noted.  No deep venous insufficiency noted of the left lower extremity.  There is significant reflux in the left great saphenous vein     Review of Systems  Cardiovascular:  Positive for leg swelling.  All other systems reviewed and are negative.      Objective:   Physical Exam Vitals reviewed.  HENT:     Head: Normocephalic.  Cardiovascular:     Rate and Rhythm: Normal rate.  Pulmonary:     Effort: Pulmonary effort is normal.  Musculoskeletal:        General:  Tenderness present.  Skin:    General: Skin is warm and dry.  Neurological:     Mental Status: She is alert and oriented to person, place, and time.  Psychiatric:        Mood and Affect: Mood normal.        Behavior: Behavior normal.        Thought Content: Thought content normal.        Judgment: Judgment normal.     BP 118/79   Pulse (!) 47   Resp 16   Ht 5\' 5"  (1.651 m)   Wt 170 lb 12.8 oz (77.5 kg)   BMI 28.42 kg/m   Past Medical History:  Diagnosis Date   HIV (human immunodeficiency virus infection) (HCC)    Hypertension     Social History   Socioeconomic History   Marital status: Single    Spouse name: Not on file   Number of children: Not on file   Years of education: Not on file   Highest education level: Not on file  Occupational History   Not on file  Tobacco Use   Smoking status: Never   Smokeless tobacco: Never  Substance and Sexual Activity   Alcohol use: Yes   Drug use: No   Sexual activity: Not on file  Other Topics Concern   Not on file  Social History Narrative   Not on file   Social Drivers of Health   Financial Resource Strain: Low Risk  (02/20/2023)   Received from [facility]  Overall Financial Resource Strain (CARDIA)    Difficulty of Paying Living Expenses: Not hard at all  Food Insecurity: No Food Insecurity (02/20/2023)   Received from [facility]   Hunger Vital Sign    Worried About Running Out of Food in the Last Year: Never true    Ran Out of Food in the Last Year: Never true  Transportation Needs: No Transportation Needs (02/20/2023)   Received from [facility] - Transportation    In the past 12 months, has lack of transportation kept you from medical appointments or from getting medications?: No    Lack of Transportation (Non-Medical): No  Physical Activity: Not on file  Stress: Not on file  Social Connections: Not on file  Intimate Partner Violence: Not  on file    Past Surgical History:  Procedure Laterality Date   BREAST EXCISIONAL BIOPSY Left    neg   lap band surgery      History reviewed. No pertinent family history.  No Known Allergies     Latest Ref Rng & Units 11/28/2022    6:54 PM 11/28/2022   12:12 PM 05/30/2022    9:29 AM  CBC  WBC 3.4 - 10.8 x10E3/uL 3.6  4.4  3.3   Hemoglobin 11.1 - 15.9 g/dL 16.1  09.6  04.5   Hematocrit 34.0 - 46.6 % 37.4  36.0  39.0   Platelets 150 - 450 x10E3/uL 224  213  186       CMP     Component Value Date/Time   NA 139 11/28/2022 1212   K 4.6 11/28/2022 1212   CL 105 11/28/2022 1212   CO2 26 11/28/2022 1212   GLUCOSE 88 11/28/2022 1212   BUN 18 11/28/2022 1212   CREATININE 0.86 11/28/2022 1212   CALCIUM 9.4 11/28/2022 1212   PROT 7.7 11/28/2022 1212   ALBUMIN 3.6 11/28/2022 1212   AST 28 11/28/2022 1212   ALT 20 11/28/2022 1212   ALKPHOS 76 11/28/2022 1212   BILITOT 1.6 (H) 11/28/2022 1212   GFRNONAA >60 11/28/2022 1212     No results found.     Assessment & Plan:   1. Varicose veins with pain (Primary)  Recommend:  The patient has large symptomatic varicose veins that are painful and associated with swelling. The patient is CEAP [id number]   I have had a long discussion with the patient regarding  varicose veins and why they cause symptoms.  Patient will begin wearing graduated compression stockings class 1 on a daily basis, beginning first thing in the morning and removing them in the evening. The patient is instructed specifically not to sleep in the stockings.    The patient  will also begin using over-the-counter analgesics such as Motrin 600 mg po TID to help control the symptoms.    In addition, behavioral modification including elevation during the day will be initiated.    Pending the results of these changes the  patient will be reevaluated in three months.    Further plans will be based on the ultrasound results and whether conservative therapies are  successful at eliminating the pain and swelling.   2. Benign essential HTN Continue antihypertensive medications as already ordered, these medications have been reviewed and there are no changes at this time.  3. Mixed hyperlipidemia Continue statin as ordered and reviewed, no changes at this time   Current Outpatient Medications on File Prior to Visit  Medication Sig Dispense Refill  cabotegravir & rilpivirine ER (CABENUVA) 600 & 900 MG/3ML injection Inject 1 kit into the muscle every 30 (thirty) days. 6 mL 1   lisinopril (ZESTRIL) 5 MG tablet Take 1 tablet by mouth daily.     OZEMPIC, 2 MG/DOSE, 8 MG/3ML SOPN INJECT 2 MG EVERY WEEK BY SUBCUTANEOUS ROUTE AS DIRECTED.     cabotegravir & rilpivirine ER (CABENUVA) 600 & 900 MG/3ML injection Inject 1 kit into the muscle every 2 (two) months. (Patient not taking: Reported on 04/26/2023) 6 mL 5   lisinopril (ZESTRIL) 10 MG tablet Take 1 tablet by mouth daily.     No current facility-administered medications on file prior to visit.    There are no Patient Instructions on file for this visit. No follow-ups on file.   Georgiana Spinner, NP

## 2023-05-01 ENCOUNTER — Other Ambulatory Visit: Payer: Self-pay

## 2023-05-04 ENCOUNTER — Other Ambulatory Visit: Payer: Self-pay | Admitting: Pharmacist

## 2023-05-04 ENCOUNTER — Other Ambulatory Visit: Payer: Self-pay

## 2023-05-04 ENCOUNTER — Other Ambulatory Visit (HOSPITAL_COMMUNITY): Payer: Self-pay

## 2023-05-04 DIAGNOSIS — B20 Human immunodeficiency virus [HIV] disease: Secondary | ICD-10-CM

## 2023-05-04 MED ORDER — CABOTEGRAVIR & RILPIVIRINE ER 600 & 900 MG/3ML IM SUER
1.0000 | INTRAMUSCULAR | 5 refills | Status: DC
  Filled 2023-05-04 (×2): qty 6, 60d supply, fill #0
  Filled 2023-07-11: qty 6, 60d supply, fill #1
  Filled 2023-09-04: qty 6, 60d supply, fill #2
  Filled 2023-11-12: qty 6, 60d supply, fill #3
  Filled 2024-01-14: qty 6, 60d supply, fill #4
  Filled 2024-03-11: qty 6, 60d supply, fill #5

## 2023-05-04 NOTE — Progress Notes (Signed)
Specialty Pharmacy Refill Coordination Note  Becky Lee is a 78 y.o. female assessed today regarding refills of clinic administered specialty medication(s) Cabotegravir & Rilpivirine [facility])   Clinic requested Courier to Provider Office   Delivery date: 05/22/23   Verified address: [address] [place] [address]   Medication will be filled on 05/21/23.

## 2023-05-07 ENCOUNTER — Other Ambulatory Visit (HOSPITAL_COMMUNITY): Payer: Self-pay

## 2023-05-18 ENCOUNTER — Other Ambulatory Visit: Payer: Self-pay

## 2023-05-21 ENCOUNTER — Other Ambulatory Visit: Payer: Self-pay

## 2023-05-29 ENCOUNTER — Ambulatory Visit: Payer: Medicare HMO

## 2023-05-29 ENCOUNTER — Other Ambulatory Visit: Payer: Self-pay

## 2023-05-29 ENCOUNTER — Ambulatory Visit: Payer: Medicare HMO | Attending: Infectious Diseases

## 2023-05-29 DIAGNOSIS — Z113 Encounter for screening for infections with a predominantly sexual mode of transmission: Secondary | ICD-10-CM

## 2023-05-29 DIAGNOSIS — B20 Human immunodeficiency virus [HIV] disease: Secondary | ICD-10-CM

## 2023-05-29 MED ORDER — CABOTEGRAVIR & RILPIVIRINE ER 600 & 900 MG/3ML IM SUER
1.0000 | Freq: Once | INTRAMUSCULAR | Status: AC
Start: 2023-05-29 — End: 2023-05-29
  Administered 2023-05-29: 1 via INTRAMUSCULAR

## 2023-05-30 ENCOUNTER — Other Ambulatory Visit
Admission: RE | Admit: 2023-05-30 | Discharge: 2023-05-30 | Disposition: A | Discharge status: Home / Self Care | Payer: Medicare HMO | Source: Ambulatory Visit | Attending: Infectious Diseases | Admitting: Infectious Diseases

## 2023-05-30 DIAGNOSIS — Z113 Encounter for screening for infections with a predominantly sexual mode of transmission: Secondary | ICD-10-CM | POA: Insufficient documentation

## 2023-05-30 DIAGNOSIS — B20 Human immunodeficiency virus [HIV] disease: Secondary | ICD-10-CM | POA: Insufficient documentation

## 2023-05-30 LAB — COMPREHENSIVE METABOLIC PANEL
ALT: 25 U/L (ref 0–44)
AST: 31 U/L (ref 15–41)
Albumin: 3.9 g/dL (ref 3.5–5.0)
Alkaline Phosphatase: 93 U/L (ref 38–126)
Anion gap: 9 (ref 5–15)
BUN: 12 mg/dL (ref 8–23)
CO2: 27 mmol/L (ref 22–32)
Calcium: 9.6 mg/dL (ref 8.9–10.3)
Chloride: 104 mmol/L (ref 98–111)
Creatinine, Ser: 0.9 mg/dL (ref 0.44–1.00)
GFR, Estimated: >60 mL/min (ref >60)
Glucose, Bld: 87 mg/dL (ref 70–99)
Potassium: 3.7 mmol/L (ref 3.5–5.1)
Sodium: 140 mmol/L (ref 135–145)
Total Bilirubin: 1.9 mg/dL — ABNORMAL HIGH (ref 0.0–1.2)
Total Protein: 7.9 g/dL (ref 6.5–8.1)

## 2023-05-30 LAB — CBC WITH DIFFERENTIAL/PLATELET
Abs Immature Granulocytes: 0.01 10*3/uL (ref 0.00–0.07)
Basophils Absolute: 0 10*3/uL (ref 0.0–0.1)
Basophils Relative: 0 %
Eosinophils Absolute: 0 10*3/uL (ref 0.0–0.5)
Eosinophils Relative: 1 %
HCT: 37.3 % (ref 36.0–46.0)
Hemoglobin: 12.9 g/dL (ref 12.0–15.0)
Immature Granulocytes: 0 %
Lymphocytes Relative: 35 %
Lymphs Abs: 1.3 10*3/uL (ref 0.7–4.0)
MCH: 28.8 pg (ref 26.0–34.0)
MCHC: 34.6 g/dL (ref 30.0–36.0)
MCV: 83.3 fL (ref 80.0–100.0)
Monocytes Absolute: 0.3 10*3/uL (ref 0.1–1.0)
Monocytes Relative: 8 %
Neutro Abs: 2 10*3/uL (ref 1.7–7.7)
Neutrophils Relative %: 56 %
Platelets: 106 10*3/uL — ABNORMAL LOW (ref 150–400)
RBC: 4.48 MIL/uL (ref 3.87–5.11)
RDW: 13.4 % (ref 11.5–15.5)
WBC: 3.6 10*3/uL — ABNORMAL LOW (ref 4.0–10.5)
nRBC: 0 % (ref 0.0–0.2)

## 2023-05-31 LAB — T-HELPER CELLS CD4/CD8 %
% CD 4 Pos. Lymph.: 39.6 % (ref 30.8–58.5)
Absolute CD 4 Helper: 554 /uL (ref 359–1519)
Basophils Absolute: 0 10*3/uL (ref 0.0–0.2)
Basos: 1 %
CD3+CD4+ Cells/CD3+CD8+ Cells Bld: 1.89 (ref 0.92–3.72)
CD3+CD8+ Cells # Bld: 293 /uL (ref 109–897)
CD3+CD8+ Cells NFr Bld: 20.9 % (ref 12.0–35.5)
EOS (ABSOLUTE): 0 10*3/uL (ref 0.0–0.4)
Eos: 1 %
Hematocrit: 39.9 % (ref 34.0–46.6)
Hemoglobin: 13.3 g/dL (ref 11.1–15.9)
Immature Grans (Abs): 0 10*3/uL (ref 0.0–0.1)
Immature Granulocytes: 0 %
Lymphocytes Absolute: 1.4 10*3/uL (ref 0.7–3.1)
Lymphs: 37 %
MCH: 28.6 pg (ref 26.6–33.0)
MCHC: 33.3 g/dL (ref 31.5–35.7)
MCV: 86 fL (ref 79–97)
Monocytes Absolute: 0.3 10*3/uL (ref 0.1–0.9)
Monocytes: 7 %
Neutrophils Absolute: 2.1 10*3/uL (ref 1.4–7.0)
Neutrophils: 54 %
Platelets: 174 10*3/uL (ref 150–450)
RBC: 4.65 x10E6/uL (ref 3.77–5.28)
RDW: 13.4 % (ref 11.7–15.4)
WBC: 3.8 10*3/uL (ref 3.4–10.8)

## 2023-05-31 LAB — HIV-1 RNA QUANT-NO REFLEX-BLD
HIV 1 RNA Quant: <20 {copies}/mL
LOG10 HIV-1 RNA: UNDETERMINED {Log}

## 2023-05-31 LAB — RPR: RPR Ser Ql: NONREACTIVE

## 2023-06-03 LAB — QUANTIFERON-TB GOLD PLUS: QuantiFERON-TB Gold Plus: NEGATIVE

## 2023-06-03 LAB — QUANTIFERON-TB GOLD PLUS (RQFGPL)
QuantiFERON Mitogen Value: >10 [IU]/mL
QuantiFERON Nil Value: 0.04 [IU]/mL
QuantiFERON TB1 Ag Value: 0.03 [IU]/mL
QuantiFERON TB2 Ag Value: 0.02 [IU]/mL

## 2023-07-11 ENCOUNTER — Other Ambulatory Visit: Payer: Self-pay

## 2023-07-11 ENCOUNTER — Other Ambulatory Visit (HOSPITAL_COMMUNITY): Payer: Self-pay

## 2023-07-11 NOTE — Progress Notes (Signed)
 Specialty Pharmacy Refill Coordination Note  Becky Lee is a 78 y.o. female assessed today regarding refills of clinic administered specialty medication(s) Cabotegravir & Rilpivirine [facility])   Clinic requested Courier to Provider Office   Delivery date: 07/17/23   Verified address: [address] [place] [address]   Medication will be filled on 07/16/23.

## 2023-07-16 ENCOUNTER — Other Ambulatory Visit: Payer: Self-pay

## 2023-07-25 ENCOUNTER — Ambulatory Visit (INDEPENDENT_AMBULATORY_CARE_PROVIDER_SITE_OTHER): Payer: Medicare HMO | Admitting: Nurse Practitioner

## 2023-07-25 ENCOUNTER — Encounter (INDEPENDENT_AMBULATORY_CARE_PROVIDER_SITE_OTHER): Payer: Self-pay | Admitting: Nurse Practitioner

## 2023-07-25 VITALS — BP 123/83 | HR 57 | Resp 16 | Ht 65.5 in | Wt 164.8 lb

## 2023-07-25 DIAGNOSIS — I83819 Varicose veins of unspecified lower extremities with pain: Secondary | ICD-10-CM

## 2023-07-25 DIAGNOSIS — I10 Essential (primary) hypertension: Secondary | ICD-10-CM

## 2023-07-25 DIAGNOSIS — E782 Mixed hyperlipidemia: Secondary | ICD-10-CM | POA: Diagnosis not present

## 2023-07-25 NOTE — Progress Notes (Signed)
 Subjective:    Patient ID: Becky Lee, female    DOB: Dec 13, 1945, 78 y.o.   MRN: [id number] Chief Complaint  Patient presents with   Follow-up    3 month follow up    The patient returns for followup evaluation 3 months after the initial visit. The patient continues to have pain in the lower extremities with dependency. The pain is lessened with elevation. Graduated compression stockings, Class I (20-30 mmHg), have been worn but the stockings do not eliminate the leg pain. Over-the-counter analgesics do not improve the symptoms. The degree of discomfort continues to interfere with daily activities. The patient notes the pain in the legs is causing problems with daily exercise, at the workplace and even with household activities and maintenance such as standing in the kitchen preparing meals and doing dishes.   Venous ultrasound shows normal deep venous system, no evidence of acute or chronic DVT.  Superficial reflux is present in the Left GSV    Review of Systems  Cardiovascular:  Positive for leg swelling.  All other systems reviewed and are negative.      Objective:   Physical Exam Vitals reviewed.  HENT:     Head: Normocephalic.  Cardiovascular:     Rate and Rhythm: Normal rate.  Pulmonary:     Effort: Pulmonary effort is normal.  Musculoskeletal:        General: Tenderness present.  Skin:    General: Skin is warm and dry.  Neurological:     Mental Status: She is alert and oriented to person, place, and time.  Psychiatric:        Mood and Affect: Mood normal.        Behavior: Behavior normal.        Thought Content: Thought content normal.        Judgment: Judgment normal.     BP 123/83   Pulse (!) 57   Resp 16   Ht 5' 5.5" (1.664 m)   Wt 164 lb 12.8 oz (74.8 kg)   BMI 27.01 kg/m   Past Medical History:  Diagnosis Date   HIV (human immunodeficiency virus infection) (HCC)    Hypertension     Social History   Socioeconomic History   Marital status:  Single    Spouse name: Not on file   Number of children: Not on file   Years of education: Not on file   Highest education level: Not on file  Occupational History   Not on file  Tobacco Use   Smoking status: Never   Smokeless tobacco: Never  Substance and Sexual Activity   Alcohol use: Yes   Drug use: No   Sexual activity: Not on file  Other Topics Concern   Not on file  Social History Narrative   Not on file   Social Drivers of Health   Financial Resource Strain: Low Risk  (06/11/2023)   Received from [facility]   Overall Financial Resource Strain (CARDIA)    Difficulty of Paying Living Expenses: Not hard at all  Food Insecurity: No Food Insecurity (06/11/2023)   Received from [facility]   Hunger Vital Sign    Worried About Running Out of Food in the Last Year: Never true    Ran Out of Food in the Last Year: Never true  Transportation Needs: No Transportation Needs (06/11/2023)   Received [facility] - [facility]    In the past 12 months, has  lack of transportation kept you from medical appointments or from getting medications?: No    Lack of Transportation (Non-Medical): No  Physical Activity: Not on file  Stress: Not on file  Social Connections: Not on file  Intimate Partner Violence: Not on file    Past Surgical History:  Procedure Laterality Date   BREAST EXCISIONAL BIOPSY Left    neg   lap band surgery      History reviewed. No pertinent family history.  No Known Allergies     Latest Ref Rng & Units 05/30/2023   10:54 AM 11/28/2022    6:54 PM 11/28/2022   12:12 PM  CBC  WBC 4.0 - 10.5 K/uL 3.4 - 10.8 x10E3/uL 3.6    3.8  3.6  4.4   Hemoglobin 12.0 - 15.0 g/dL 16.1 - 09.6 g/dL 04.5    40.9  81.1  91.4   Hematocrit 36.0 - 46.0 % 34.0 - 46.6 % 37.3    39.9  37.4  36.0   Platelets 150 - 400 K/uL 150 - 450 x10E3/uL 106    174  224  213       CMP     Component Value Date/Time    NA 140 05/30/2023 1054   K 3.7 05/30/2023 1054   CL 104 05/30/2023 1054   CO2 27 05/30/2023 1054   GLUCOSE 87 05/30/2023 1054   BUN 12 05/30/2023 1054   CREATININE 0.90 05/30/2023 1054   CALCIUM 9.6 05/30/2023 1054   PROT 7.9 05/30/2023 1054   ALBUMIN 3.9 05/30/2023 1054   AST 31 05/30/2023 1054   ALT 25 05/30/2023 1054   ALKPHOS 93 05/30/2023 1054   BILITOT 1.9 (H) 05/30/2023 1054   GFRNONAA >60 05/30/2023 1054     No results found.     Assessment & Plan:   1. Varicose veins with pain (Primary) Recommend  I have reviewed my previous  discussion with the patient regarding  varicose veins and why they cause symptoms. Patient will continue  wearing graduated compression stockings class 1 on a daily basis, beginning first thing in the morning and removing them in the evening.  The patient is CEAP C3sEpAsPr.  The patient has been wearing compression for more than 12 weeks with no or little benefit.  The patient has been exercising daily for more than 12 weeks. The patient has been elevating and taking OTC pain medications for more than 12 weeks.  None of these have have eliminated the pain related to the varicose veins and venous reflux or the discomfort regarding venous congestion.    In addition, behavioral modification including elevation during the day was again discussed and this will continue.  The patient has utilized over the counter pain medications and has been exercising.  However, at this time conservative therapy has not alleviated the patient's symptoms of leg pain and swelling  Recommend: laser ablation of the left great saphenous veins to eliminate the symptoms of pain and swelling of the lower extremities caused by the severe superficial venous reflux disease.   2. Benign essential HTN Continue antihypertensive medications as already ordered, these medications have been reviewed and there are no changes at this time.  3. Mixed hyperlipidemia Continue statin  as ordered and reviewed, no changes at this time   Current Outpatient Medications on File Prior to Visit  Medication Sig Dispense Refill   cabotegravir & rilpivirine ER (CABENUVA) 600 & 900 MG/3ML injection Inject 1 kit into the muscle every 2 (two) months. 6 mL  5   lisinopril (ZESTRIL) 5 MG tablet Take 1 tablet by mouth daily.     OZEMPIC, 2 MG/DOSE, 8 MG/3ML SOPN INJECT 2 MG EVERY WEEK BY SUBCUTANEOUS ROUTE AS DIRECTED.     lisinopril (ZESTRIL) 10 MG tablet Take 1 tablet by mouth daily.     No current facility-administered medications on file prior to visit.    There are no Patient Instructions on file for this visit. No follow-ups on file.   Gearld Kerstein E Aeon Koors, NP

## 2023-07-26 ENCOUNTER — Ambulatory Visit: Payer: Medicare HMO | Attending: Infectious Diseases | Admitting: Infectious Diseases

## 2023-07-26 ENCOUNTER — Encounter: Payer: Self-pay | Admitting: Infectious Diseases

## 2023-07-26 VITALS — BP 125/82 | HR 57 | Temp 97.2°F | Ht 65.0 in | Wt 166.0 lb

## 2023-07-26 DIAGNOSIS — D72819 Decreased white blood cell count, unspecified: Secondary | ICD-10-CM | POA: Diagnosis not present

## 2023-07-26 DIAGNOSIS — B20 Human immunodeficiency virus [HIV] disease: Secondary | ICD-10-CM | POA: Insufficient documentation

## 2023-07-26 DIAGNOSIS — I8392 Asymptomatic varicose veins of left lower extremity: Secondary | ICD-10-CM | POA: Insufficient documentation

## 2023-07-26 DIAGNOSIS — R7989 Other specified abnormal findings of blood chemistry: Secondary | ICD-10-CM | POA: Diagnosis not present

## 2023-07-26 DIAGNOSIS — I2510 Atherosclerotic heart disease of native coronary artery without angina pectoris: Secondary | ICD-10-CM | POA: Insufficient documentation

## 2023-07-26 DIAGNOSIS — Z79899 Other long term (current) drug therapy: Secondary | ICD-10-CM | POA: Diagnosis not present

## 2023-07-26 DIAGNOSIS — B942 Sequelae of viral hepatitis: Secondary | ICD-10-CM | POA: Insufficient documentation

## 2023-07-26 DIAGNOSIS — H04122 Dry eye syndrome of left lacrimal gland: Secondary | ICD-10-CM | POA: Diagnosis not present

## 2023-07-26 DIAGNOSIS — H01146 Xeroderma of left eye, unspecified eyelid: Secondary | ICD-10-CM

## 2023-07-26 DIAGNOSIS — Z9884 Bariatric surgery status: Secondary | ICD-10-CM | POA: Diagnosis not present

## 2023-07-26 DIAGNOSIS — I10 Essential (primary) hypertension: Secondary | ICD-10-CM | POA: Insufficient documentation

## 2023-07-26 MED ORDER — CABOTEGRAVIR & RILPIVIRINE ER 600 & 900 MG/3ML IM SUER
1.0000 | Freq: Once | INTRAMUSCULAR | Status: AC
  Administered 2023-07-26: 1 via INTRAMUSCULAR

## 2023-07-26 NOTE — Progress Notes (Signed)
 NAME: Becky Lee  DOB: [birth year]  MRN: [id number]  Date/Time: 07/26/2023 8:40 AM Subjective:   Pt is here for  HIV care Last seen Dec 2024 Since that visit saw podiatrist Dr.Cline in Feb 2025 and daignsoed with Tibialis tendinitis  and given  ASO ankle brace Received hyularon injection left knee on 06/25/23 Saw vein and vascular on 07/25/23 for left leg varicose veins and will be getting laser Rx She is on ozempic and has lost 75 pounds She is doing well Is going on a cruise this weekend  On cabenuva injections  Vl < 20 and cd4 was 554 ( 39%) on 05/30/23 Platelet was 105  in feb ( has not been low before) she will se eher PCP in May and says she will get it checked  Becky Lee is a 78 y.o. female with a history of HIV, HTN, Morbid obesity, had lap band surgery complicated  by , port infection, removal of band in 2018, autoimmune hepatitis.  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 Barkley Boards at [facility] for bariatric surgery in [place].   She was diagnosed in 1984 and she states it was secondary to a blood transfusion she had for total abdominal hysterectomy.  She remembers that her nadir CD4 was in single digits.  She was in [place] then.   Her cd4 1118 and Vl < 20 on Sep 07, 2017.. 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 Not sexually active in many years now  AIDS diagnosed 1984 Nadir Cd4 <50 OI - zoster,  HAARt history- many regimens Combivir, Valetta Fuller Descovy + tivicay- DC 04/28/21 Cabenuva 04/28/21  Acquired thru- blood transfusion for TAH in 1984  ? Past Medical History:  Diagnosis Date   HIV (human immunodeficiency virus  infection) (HCC)    Hypertension    morbid obesity Autoimmune hepatitis  In 2008- took steroids and azothiprine Fibroid uterus with mennorhagia 12/19/ 2019- SPECT study FINDINGS: Regional wall motion:  reveals normal myocardial thickening and wall motion.The overall quality of the study is good.   Left ventricular cavity: normal. Perfusion Analysis:  SPECT images demonstrate homogeneous tracer distribution throughout the myocardium.    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  FH: alcoholism- deceased-brother Liver /lung cancer dad CVA-mother  Allergy-NKDA ? Meds lisinopril MVT ( bariatric advantage multivitamin) Cabenuva   REVIEW OF SYSTEMS:  Const: negative fever, negative chills,lost 45 pounds since starting ozempic April 2024  Eyes: negative diplopia or visual changes, negative eye pain ENT: has coryza, negative sore throat Broken teeth- got a  new plate Resp: negative cough, hemoptysis, dyspnea Cards: negative for chest pain, palpitations, lower extremity edema GU: negative for frequency, dysuria and hematuria Skin: negative for rash and pruritus Heme: negative for easy bruising and gum/nose bleeding MSK   Neurolo:negative for headaches, dizziness, vertigo, memory problems  Psych: negative for  feelings of anxiety, depression   Objective:  VITALS:  BP 125/82   Pulse (!) 57   Temp (!) 97.2 F (36.2 C) (Temporal)   Ht 5\' 5"  (1.651 m)   Wt 166 lb (75.3 kg)   SpO2 96%   BMI 27.62 kg/m   PHYSICAL EXAM:  General: looks well HEENT N.  Lungs: Clear to auscultation bilaterally. No Wheezing or Rhonchi. No rales. Heart: Regular rate and rhythm,  no murmur, rub or gallop. Abdomen:did not examine today Extremities: left leg varicose vein Skin: No rashes or lesions. Not Jaundiced Lymph: Cervical, supraclavicular normal. Neurologic: Grossly non-focal  Health maintenance Vaccination  pneumovac- 23-  01/03/18 Prevnar-13-11/30/18 HepB unknown HepA unknown TdaP 07/28/20 Flu this season Herpes zoster-09/12/19-  2nd dose - given - she will get the date MRNA vaccine ( pfizer X3) Flu vaccine 2022 ______________________ labs RPR-NR on Jan 2023 HEPC ab-NR on 05/14/2018 Lipid- 07/25/21 - TC 147,TGL 68, HDL 62, LDL 71, HIV VL<20 Cd4  617 ( 36%) Jan 2023 quantiferon Gold-nonreactive from Jan 2023 [id number] Geno sure archive done on 05/14/2018 does not show significant mutation.  HIV antibody TSH 1.25 ( 06/13/19) ------------------------------------------------------------  Preventive  Dental- followed currently at [facility] Colonoscopy- 05/22/19 Opthal cervical pap-not sexually active and has not had it in many years Mammogram-02/08/22  LABS T cell count: 531 (11/2022) T cell percentage: 38.6 (2020) T cell percentage: 31.8 (2020) T cell percentage: 37.9 T cell percentage: 46 T cell percentage: 47 WBC: low Viral load: undetectable Total cholesterol: 133 (08/2022) HDL: 55 (08/2022) LDL: 68 (08/2022) Bilirubin: 1.6 (11/2022)  Impression/Recommendation ?78 year old female with history of HIV AIDS diagnosed in the 44s   ? ?HIV   Stable on Cabenuva with undetectable viral load. CD4 count 544 and 39%.   -Continue Cabenuva injections.   -Administer Cabenuva injection today.    Varicose vins left leg- seeing Vein and vascular   Eye dryness  Complaint of spontaneous tearing from left eye. Eye doctor has evaluated and found no blockage.   -Continue Systane drops as prescribed by eye doctor.     Abnormal Lfts on 07/19/21- History of autoimmune hepatitis was on prednisone and azathioprine when she was in [place]  but not  anymore.  There is a possibility that it was drug-induced hepatitis.( AST 54, ALT 45 and bili 1.6--all normal in April 2023) /HEPB/HEPC/HEPA neg   Leucopenia- chronic and stable -  Hypertension well controlled   History of coronary artery disease diagnosed when she had a work-up for the first lap band surgery SPECT scan done 03/28/18 was normal   Statin -contraindicated because of autoimmune hepatitis and abnormal LFTS  Lipids -very good ( TC 147/ Hdl 62/LDL 71/TGL 147)    Obesity: Had lap band surgery in 2008 and it was removed in 2018 because of the port infection.  On 05/30/2018 she underwent Roux-en-Y bypass She is now on ozempic  Discussed the management with the patient in great detail  Follow up 2 months for cabenuva injection and labs

## 2023-08-15 ENCOUNTER — Ambulatory Visit
Admission: EM | Admit: 2023-08-15 | Discharge: 2023-08-15 | Disposition: A | Discharge status: Home / Self Care | Attending: Emergency Medicine | Admitting: Emergency Medicine

## 2023-08-15 DIAGNOSIS — B349 Viral infection, unspecified: Secondary | ICD-10-CM

## 2023-08-15 LAB — POC COVID19/FLU A&B COMBO
Covid Antigen, POC: NEGATIVE
Influenza A Antigen, POC: NEGATIVE
Influenza B Antigen, POC: NEGATIVE

## 2023-08-15 MED ORDER — ACETAMINOPHEN 325 MG PO TABS
650.0000 mg | ORAL_TABLET | Freq: Once | ORAL | Status: AC
  Administered 2023-08-15: 650 mg via ORAL

## 2023-08-15 NOTE — ED Provider Notes (Signed)
 Becky Lee    CSN: [id number] Arrival date & time: 08/15/23  1135      History   Chief Complaint Chief Complaint  Patient presents with   Cough   Fever    HPI Becky Lee is a 78 y.o. female.  Patient presents with 3-day history of fever, congestion, cough, headache.  No OTC medication taken today.  No shortness of breath, abdominal pain, vomiting, diarrhea.  The history is provided by the patient and medical records.    Past Medical History:  Diagnosis Date   HIV (human immunodeficiency virus infection) (HCC)    Hypertension     Patient Active Problem List   Diagnosis Date Noted   Gastritis without bleeding 03/31/2019   Hiatal hernia 04/24/2018   Bradycardia 03/29/2018   Gallstones 01/09/2018   Vitamin D deficiency 12/19/2017   Morbid obesity with BMI of 40.0-44.9, adult (HCC) 05/09/2017   Hypertension 05/23/2016   Other specified bacterial agents as the cause of diseases classified elsewhere 05/01/2016   Hepatitis, autoimmune (HCC) 04/28/2016   Human immunodeficiency virus I infection (HCC) 04/28/2016   Wound infection complicating hardware (HCC) 04/28/2016   Postoperative infection 04/27/2016   Bariatric surgery status 04/24/2016   Benign essential HTN 09/28/2015   CAD (coronary artery disease) 09/28/2015   Mixed hyperlipidemia 09/28/2015    Past Surgical History:  Procedure Laterality Date   BREAST EXCISIONAL BIOPSY Left    neg   lap band surgery      OB History   No obstetric history on file.      Home Medications    Prior to Admission medications   Medication Sig Start Date End Date Taking? Authorizing Provider  cabotegravir  & rilpivirine  ER (CABENUVA ) 600 & 900 MG/3ML injection Inject 1 kit into the muscle every 2 (two) months. 05/04/23   Sonya Duster, RPH-CPP  lisinopril (ZESTRIL) 5 MG tablet Take 1 tablet by mouth daily. 02/21/23   [provider]  OZEMPIC, 2 MG/DOSE, 8 MG/3ML SOPN INJECT 2 MG EVERY WEEK BY  SUBCUTANEOUS ROUTE AS DIRECTED.    [provider]    Family History History reviewed. No pertinent family history.  Social History Social History   Tobacco Use   Smoking status: Never   Smokeless tobacco: Never  Substance Use Topics   Alcohol use: Yes   Drug use: No     Allergies   Patient has no known allergies.   Review of Systems Review of Systems  Constitutional:  Positive for fever. Negative for chills.  HENT:  Positive for congestion. Negative for ear pain and sore throat.   Respiratory:  Positive for cough. Negative for shortness of breath.   Gastrointestinal:  Negative for abdominal pain, diarrhea, nausea and vomiting.  Neurological:  Positive for headaches.     Physical Exam Triage Vital Signs ED Triage Vitals  Encounter Vitals Group     BP 08/15/23 1140 132/82     Systolic BP Percentile --      Diastolic BP Percentile --      Pulse Rate 08/15/23 1140 91     Resp 08/15/23 1140 18     Temp 08/15/23 1140 (!) 100.9 F (38.3 C)     Temp Source 08/15/23 1140 Oral     SpO2 08/15/23 1140 96 %     Weight --      Height --      Head Circumference --      Peak Flow --      Pain Score  08/15/23 1141 0     Pain Loc --      Pain Education --      Exclude from Growth Chart --    No data found.  Updated Vital Signs BP 132/82 (BP Location: Left Arm)   Pulse 91   Temp (!) 100.9 F (38.3 C) (Oral)   Resp 18   SpO2 96%   Visual Acuity Right Eye Distance:   Left Eye Distance:   Bilateral Distance:    Right Eye Near:   Left Eye Near:    Bilateral Near:     Physical Exam Constitutional:      General: She is not in acute distress. HENT:     Right Ear: Tympanic membrane normal.     Left Ear: Tympanic membrane normal.     Nose: Nose normal.     Mouth/Throat:     Mouth: Mucous membranes are moist.     Pharynx: Oropharynx is clear.  Cardiovascular:     Rate and Rhythm: Normal rate and regular rhythm.     Heart sounds: Normal heart sounds.   Pulmonary:     Effort: Pulmonary effort is normal. No respiratory distress.     Breath sounds: Normal breath sounds.  Neurological:     Mental Status: She is alert.      UC Treatments / Results  Labs (all labs ordered are listed, but only abnormal results are displayed) Labs Reviewed  POC COVID19/FLU A&B COMBO - Normal    EKG   Radiology No results found.  Procedures Procedures (including critical care time)  Medications Ordered in UC Medications  acetaminophen  (TYLENOL ) tablet 650 mg (650 mg Oral Given 08/15/23 1201)    Initial Impression / Assessment and Plan / UC Course  I have reviewed the triage vital signs and the nursing notes.  Pertinent labs & imaging results that were available during my care of the patient were reviewed by me and considered in my medical decision making (see chart for details).    Viral illness.  Tylenol  given here for fever.  Rapid COVID and flu negative.  Discussed symptomatic treatment including Tylenol  or ibuprofen  as needed for fever or discomfort, rest, hydration.  Instructed patient to follow-up with PCP if not improving.  ED precautions given.  Patient agrees to plan of care.  Final Clinical Impressions(s) / UC Diagnoses   Final diagnoses:  Viral illness     Discharge Instructions      The COVID and flu tests are negative.   Take Tylenol  or ibuprofen  as needed for fever or discomfort.  Rest and keep yourself hydrated.    Follow-up with your primary care provider if your symptoms are not improving.         ED Prescriptions   None    PDMP not reviewed this encounter.   Wellington Half, NP 08/15/23 1250

## 2023-08-15 NOTE — ED Triage Notes (Signed)
 Pt reports she has a cough, fever, nasal congestion, n/v and headache x 3 days    Took tylenol  and mucinex  Took 2 home covid test with neg results.

## 2023-08-15 NOTE — Discharge Instructions (Addendum)
 The COVID and flu tests are negative.   Take Tylenol or ibuprofen as needed for fever or discomfort.  Rest and keep yourself hydrated.    Follow-up with your primary care provider if your symptoms are not improving.

## 2023-08-22 ENCOUNTER — Other Ambulatory Visit: Payer: Self-pay | Admitting: Internal Medicine

## 2023-08-22 DIAGNOSIS — Z1231 Encounter for screening mammogram for malignant neoplasm of breast: Secondary | ICD-10-CM

## 2023-08-28 ENCOUNTER — Telehealth (INDEPENDENT_AMBULATORY_CARE_PROVIDER_SITE_OTHER): Payer: Self-pay | Admitting: Vascular Surgery

## 2023-08-28 ENCOUNTER — Encounter (INDEPENDENT_AMBULATORY_CARE_PROVIDER_SITE_OTHER): Payer: Self-pay

## 2023-08-28 ENCOUNTER — Other Ambulatory Visit (INDEPENDENT_AMBULATORY_CARE_PROVIDER_SITE_OTHER): Payer: Self-pay

## 2023-08-28 NOTE — Telephone Encounter (Signed)
 Sent!

## 2023-08-28 NOTE — Telephone Encounter (Signed)
 Pt is scheduled for a left leg GSV laser with Dr. Prescilla Brod on 5.29.25. Pt will need the standard RX protocol called in to [facility] in [place]. Thank you.

## 2023-08-28 NOTE — Telephone Encounter (Signed)
 Laser 09/06/23

## 2023-09-04 ENCOUNTER — Other Ambulatory Visit: Payer: Self-pay

## 2023-09-04 ENCOUNTER — Other Ambulatory Visit (HOSPITAL_COMMUNITY): Payer: Self-pay

## 2023-09-04 MED ORDER — ALPRAZOLAM 0.5 MG PO TABS: ORAL_TABLET | ORAL | 0 refills | Status: DC

## 2023-09-04 NOTE — Progress Notes (Signed)
 Specialty Pharmacy Refill Coordination Note  Becky Lee is a 78 y.o. female assessed today regarding refills of clinic administered specialty medication(s) Cabotegravir  & Rilpivirine  (CABENUVA )   Clinic requested Courier to Provider Office   Delivery date: 09/18/23   Verified address: [address] [place] [address]   Medication will be filled on 09/17/23.

## 2023-09-05 DIAGNOSIS — I8310 Varicose veins of unspecified lower extremity with inflammation: Secondary | ICD-10-CM | POA: Insufficient documentation

## 2023-09-05 NOTE — Progress Notes (Unsigned)
    MRN : [id number]  Becky Lee is a 78 y.o. ([birth year]) female who presents with chief complaint of No chief complaint on file. .    The patient's left lower extremity was sterilely prepped and draped.  The ultrasound machine was used to visualize the left great saphenous vein throughout its course.  A segment at the knee was selected for access.  The saphenous vein was accessed without difficulty using ultrasound guidance with a micropuncture needle.   An 0.018  wire was placed beyond the saphenofemoral junction through the sheath and the microneedle was removed.  The 65 cm sheath was then placed over the wire and the wire and dilator were removed.  The laser fiber was placed through the sheath and its tip was placed approximately 2 cm below the saphenofemoral junction.  Tumescent anesthesia was then created with a dilute lidocaine solution.  Laser energy was then delivered with constant withdrawal of the sheath and laser fiber.  Approximately 1440 Joules of energy were delivered over a length of 30 cm.  Sterile dressings were placed.  The patient tolerated the procedure well without complications.

## 2023-09-06 ENCOUNTER — Other Ambulatory Visit (INDEPENDENT_AMBULATORY_CARE_PROVIDER_SITE_OTHER): Payer: Self-pay | Admitting: Vascular Surgery

## 2023-09-06 ENCOUNTER — Ambulatory Visit (INDEPENDENT_AMBULATORY_CARE_PROVIDER_SITE_OTHER): Admitting: Vascular Surgery

## 2023-09-06 ENCOUNTER — Encounter (INDEPENDENT_AMBULATORY_CARE_PROVIDER_SITE_OTHER): Payer: Self-pay | Admitting: Vascular Surgery

## 2023-09-06 VITALS — BP 116/79 | HR 74 | Resp 16 | Wt 164.2 lb

## 2023-09-06 DIAGNOSIS — I8310 Varicose veins of unspecified lower extremity with inflammation: Secondary | ICD-10-CM

## 2023-09-06 DIAGNOSIS — E782 Mixed hyperlipidemia: Secondary | ICD-10-CM

## 2023-09-06 DIAGNOSIS — I10 Essential (primary) hypertension: Secondary | ICD-10-CM

## 2023-09-06 DIAGNOSIS — I8312 Varicose veins of left lower extremity with inflammation: Secondary | ICD-10-CM

## 2023-09-06 DIAGNOSIS — I25119 Atherosclerotic heart disease of native coronary artery with unspecified angina pectoris: Secondary | ICD-10-CM

## 2023-09-13 ENCOUNTER — Other Ambulatory Visit (INDEPENDENT_AMBULATORY_CARE_PROVIDER_SITE_OTHER)

## 2023-09-13 DIAGNOSIS — I8310 Varicose veins of unspecified lower extremity with inflammation: Secondary | ICD-10-CM

## 2023-09-13 DIAGNOSIS — I8312 Varicose veins of left lower extremity with inflammation: Secondary | ICD-10-CM | POA: Diagnosis not present

## 2023-09-17 ENCOUNTER — Other Ambulatory Visit: Payer: Self-pay

## 2023-09-20 ENCOUNTER — Encounter: Payer: Self-pay | Admitting: Urology

## 2023-09-20 ENCOUNTER — Ambulatory Visit: Admitting: Urology

## 2023-09-20 VITALS — BP 131/82 | HR 56 | Ht 65.0 in | Wt 164.2 lb

## 2023-09-20 DIAGNOSIS — R310 Gross hematuria: Secondary | ICD-10-CM

## 2023-09-20 DIAGNOSIS — Z87442 Personal history of urinary calculi: Secondary | ICD-10-CM

## 2023-09-20 DIAGNOSIS — R3129 Other microscopic hematuria: Secondary | ICD-10-CM | POA: Diagnosis not present

## 2023-09-20 LAB — URINALYSIS, COMPLETE
Bilirubin, UA: NEGATIVE
Glucose, UA: NEGATIVE
Ketones, UA: NEGATIVE
Leukocytes,UA: NEGATIVE
Nitrite, UA: NEGATIVE
Protein,UA: NEGATIVE
Specific Gravity, UA: 1.03 (ref 1.005–1.030)
Urobilinogen, Ur: 1 mg/dL (ref 0.2–1.0)
pH, UA: 6 (ref 5.0–7.5)

## 2023-09-20 LAB — MICROSCOPIC EXAMINATION: Epithelial Cells (non renal): >10 /HPF — AB (ref 0–10)

## 2023-09-20 NOTE — Progress Notes (Signed)
 Becky Lee,acting as a scribe for Dustin Gimenez, MD.,have documented all relevant documentation on the behalf of Dustin Gimenez, MD,as directed by  Dustin Gimenez, MD while in the presence of Dustin Gimenez, MD.  09/20/23 3:19 PM   Becky Lee Dec 21, 1945 [id number]  Referring provider: Antonio Baumgarten, MD [address] [facility] [place] [place],  [place] [address]  Chief Complaint  Patient presents with   Hematuria    HPI:  78 year old female presents today for further evaluation of gross and microscopic hematuria. She reports that she has not observed visible blood in her urine, either in her underwear or in the toilet bowl.   The presence of blood in her urine has been noted during routine urine tests, which she reviews on her chart.   She has a history of kidney stones noted in a previous exam.   She denies a history of smoking.   She expresses surprise that her primary care provider has not pursued further testing despite the ongoing presence of blood in her urine over a long period. She speculates that the blood might be due to scratching herself, as she has long nails.  Results for orders placed or performed in visit on 09/20/23  Microscopic Examination   Urine  Result Value Ref Range   WBC, UA 0-5 0 - 5 /hpf   RBC, Urine 11-30 (A) 0 - 2 /hpf   Epithelial Cells (non renal) >10 (A) 0 - 10 /hpf   Mucus, UA Present (A) Not Estab.   Bacteria, UA Few None seen/Few  Urinalysis, Complete  Result Value Ref Range   Specific Gravity, UA 1.030 1.005 - 1.030   pH, UA 6.0 5.0 - 7.5   Color, UA Yellow Yellow   Appearance Ur Clear Clear   Leukocytes,UA Negative Negative   Protein,UA Negative Negative/Trace   Glucose, UA Negative Negative   Ketones, UA Negative Negative   RBC, UA 1+ (A) Negative   Bilirubin, UA Negative Negative   Urobilinogen, Ur 1.0 0.2 - 1.0 mg/dL   Nitrite, UA Negative Negative   Microscopic Examination See below:     PMH: Past  Medical History:  Diagnosis Date   HIV (human immunodeficiency virus infection) (HCC)    Hypertension     Surgical History: Past Surgical History:  Procedure Laterality Date   BREAST EXCISIONAL BIOPSY Left    neg   lap band surgery      Home Medications:  Allergies as of 09/20/2023   No Known Allergies      Medication List        Accurate as of September 20, 2023  3:19 PM. If you have any questions, ask your nurse or doctor.          STOP taking these medications    ALPRAZolam  0.5 MG tablet Commonly known as: Xanax  Stopped by: Dustin Gimenez       TAKE these medications    Cabenuva  600 & 900 MG/3ML injection Generic drug: cabotegravir  & rilpivirine  ER Inject 1 kit into the muscle every 2 (two) months.   lisinopril 5 MG tablet Commonly known as: ZESTRIL Take 1 tablet by mouth daily.   Ozempic (2 MG/DOSE) 8 MG/3ML Sopn Generic drug: Semaglutide (2 MG/DOSE) INJECT 2 MG EVERY WEEK BY SUBCUTANEOUS ROUTE AS DIRECTED.         Social History:  reports that she has never smoked. She has never used smokeless tobacco. She reports current alcohol use. She reports that she does not  use drugs.   Physical Exam: BP 131/82   Pulse (!) 56   Ht 5' 5 (1.651 m)   Wt 164 lb 4 oz (74.5 kg)   BMI 27.33 kg/m   Constitutional:  Alert and oriented, No acute distress. HEENT:  AT, moist mucus membranes.  Trachea midline, no masses. Neurologic: Grossly intact, no focal deficits, moving all 4 extremities. Psychiatric: Normal mood and affect.   Assessment & Plan:    1. Microscopic Hematuria - Differential diagnosis includes urinary tract infection, kidney stones, and potential contamination of the urine sample. The presence of epithelial cells suggests possible contamination. - Schedule an appointment for catheterized urine specimen collection to determine if hematuria is real or due to contamination. If hematuria is confirmed, proceed with further evaluation including a CT  Urogram and cystoscopy to rule out bladder cancer or other urological conditions.  2. History of Kidney Stones - She is uncertain about the history of kidney stones, but it is noted in her medical records. - If hematuria is confirmed, consider kidney stones as a potential cause and evaluate accordingly.  Return for catheterized urine specimen collection with PA.  I have reviewed the above documentation for accuracy and completeness, and I agree with the above.   Dustin Gimenez, MD    [facility] [address] [place], [place] [address] [phone]

## 2023-09-25 ENCOUNTER — Ambulatory Visit: Admitting: Infectious Diseases

## 2023-09-25 ENCOUNTER — Ambulatory Visit: Attending: Infectious Diseases

## 2023-09-25 ENCOUNTER — Ambulatory Visit

## 2023-09-25 ENCOUNTER — Other Ambulatory Visit
Admission: RE | Admit: 2023-09-25 | Discharge: 2023-09-25 | Disposition: A | Discharge status: Home / Self Care | Source: Ambulatory Visit | Attending: Infectious Diseases | Admitting: Infectious Diseases

## 2023-09-25 ENCOUNTER — Other Ambulatory Visit: Payer: Self-pay

## 2023-09-25 DIAGNOSIS — B20 Human immunodeficiency virus [HIV] disease: Secondary | ICD-10-CM | POA: Diagnosis not present

## 2023-09-25 DIAGNOSIS — Z113 Encounter for screening for infections with a predominantly sexual mode of transmission: Secondary | ICD-10-CM

## 2023-09-25 LAB — CBC WITH DIFFERENTIAL/PLATELET
Abs Immature Granulocytes: 0.01 10*3/uL (ref 0.00–0.07)
Basophils Absolute: 0 10*3/uL (ref 0.0–0.1)
Basophils Relative: 1 %
Eosinophils Absolute: 0 10*3/uL (ref 0.0–0.5)
Eosinophils Relative: 1 %
HCT: 36.1 % (ref 36.0–46.0)
Hemoglobin: 12.1 g/dL (ref 12.0–15.0)
Immature Granulocytes: 0 %
Lymphocytes Relative: 44 %
Lymphs Abs: 1.6 10*3/uL (ref 0.7–4.0)
MCH: 28.3 pg (ref 26.0–34.0)
MCHC: 33.5 g/dL (ref 30.0–36.0)
MCV: 84.3 fL (ref 80.0–100.0)
Monocytes Absolute: 0.2 10*3/uL (ref 0.1–1.0)
Monocytes Relative: 7 %
Neutro Abs: 1.7 10*3/uL (ref 1.7–7.7)
Neutrophils Relative %: 47 %
Platelets: 197 10*3/uL (ref 150–400)
RBC: 4.28 MIL/uL (ref 3.87–5.11)
RDW: 14.4 % (ref 11.5–15.5)
WBC: 3.6 10*3/uL — ABNORMAL LOW (ref 4.0–10.5)
nRBC: 0 % (ref 0.0–0.2)

## 2023-09-25 LAB — COMPREHENSIVE METABOLIC PANEL WITH GFR
ALT: 33 U/L (ref 0–44)
AST: 40 U/L (ref 15–41)
Albumin: 3.9 g/dL (ref 3.5–5.0)
Alkaline Phosphatase: 88 U/L (ref 38–126)
Anion gap: 8 (ref 5–15)
BUN: 17 mg/dL (ref 8–23)
CO2: 27 mmol/L (ref 22–32)
Calcium: 9.4 mg/dL (ref 8.9–10.3)
Chloride: 104 mmol/L (ref 98–111)
Creatinine, Ser: 0.71 mg/dL (ref 0.44–1.00)
GFR, Estimated: >60 mL/min (ref >60)
Glucose, Bld: 82 mg/dL (ref 70–99)
Potassium: 4 mmol/L (ref 3.5–5.1)
Sodium: 139 mmol/L (ref 135–145)
Total Bilirubin: 2.1 mg/dL — ABNORMAL HIGH (ref 0.0–1.2)
Total Protein: 7.7 g/dL (ref 6.5–8.1)

## 2023-09-25 LAB — C-REACTIVE PROTEIN: CRP: <0.5 mg/dL (ref <1.0)

## 2023-09-25 MED ORDER — CABOTEGRAVIR & RILPIVIRINE ER 600 & 900 MG/3ML IM SUER
1.0000 | Freq: Once | INTRAMUSCULAR | Status: AC
  Administered 2023-09-25: 1 via INTRAMUSCULAR

## 2023-09-26 LAB — T-HELPER CELLS CD4/CD8 %
% CD 4 Pos. Lymph.: 38.2 % (ref 30.8–58.5)
Absolute CD 4 Helper: 573 /uL (ref 359–1519)
Basophils Absolute: 0 10*3/uL (ref 0.0–0.2)
Basos: 1 %
CD3+CD4+ Cells/CD3+CD8+ Cells Bld: 1.98 (ref 0.92–3.72)
CD3+CD8+ Cells # Bld: 290 /uL (ref 109–897)
CD3+CD8+ Cells NFr Bld: 19.3 % (ref 12.0–35.5)
EOS (ABSOLUTE): 0 10*3/uL (ref 0.0–0.4)
Eos: 1 %
Hematocrit: 38 % (ref 34.0–46.6)
Hemoglobin: 12.3 g/dL (ref 11.1–15.9)
Immature Grans (Abs): 0 10*3/uL (ref 0.0–0.1)
Immature Granulocytes: 0 %
Lymphocytes Absolute: 1.5 10*3/uL (ref 0.7–3.1)
Lymphs: 44 %
MCH: 28.7 pg (ref 26.6–33.0)
MCHC: 32.4 g/dL (ref 31.5–35.7)
MCV: 89 fL (ref 79–97)
Monocytes Absolute: 0.2 10*3/uL (ref 0.1–0.9)
Monocytes: 7 %
Neutrophils Absolute: 1.7 10*3/uL (ref 1.4–7.0)
Neutrophils: 47 %
Platelets: 204 10*3/uL (ref 150–450)
RBC: 4.28 x10E6/uL (ref 3.77–5.28)
RDW: 14.6 % (ref 11.7–15.4)
WBC: 3.5 10*3/uL (ref 3.4–10.8)

## 2023-09-26 LAB — HIV-1 RNA QUANT-NO REFLEX-BLD
HIV 1 RNA Quant: 250 {copies}/mL
LOG10 HIV-1 RNA: 2.398 {Log_copies}/mL

## 2023-09-26 LAB — RPR: RPR Ser Ql: NONREACTIVE

## 2023-10-01 ENCOUNTER — Ambulatory Visit: Admitting: Physician Assistant

## 2023-10-01 VITALS — BP 123/77 | HR 63 | Ht 65.0 in | Wt 163.0 lb

## 2023-10-01 DIAGNOSIS — R3129 Other microscopic hematuria: Secondary | ICD-10-CM

## 2023-10-01 LAB — URINALYSIS, COMPLETE
Bilirubin, UA: NEGATIVE
Glucose, UA: NEGATIVE
Ketones, UA: NEGATIVE
Leukocytes,UA: NEGATIVE
Nitrite, UA: NEGATIVE
Protein,UA: NEGATIVE
Specific Gravity, UA: <1.005 — ABNORMAL LOW (ref 1.005–1.030)
Urobilinogen, Ur: 0.2 mg/dL (ref 0.2–1.0)
pH, UA: 6 (ref 5.0–7.5)

## 2023-10-01 LAB — MICROSCOPIC EXAMINATION: Bacteria, UA: NONE SEEN

## 2023-10-01 NOTE — Progress Notes (Unsigned)
In and Out Catheterization  Patient is present today for a I & O catheterization due to ***. Patient was cleaned and prepped in a sterile fashion with betadine . A ***FR cath was inserted {dnt complications:20057} , ***ml of urine return was noted, urine was *** in color. A clean urine sample was collected for ***. Bladder was drained  And catheter was removed with out difficulty.    Performed by: ***  Follow up/ Additional notes: ***

## 2023-10-01 NOTE — Progress Notes (Unsigned)
 10/01/2023 5:28 PM   Inocente Lukes Jul 08, 1945 [id number]  CC: Chief Complaint  Patient presents with   Hematuria   HPI: Savaya Hakes is a 78 y.o. female with PMH nephrolithiasis and microscopic hematuria with possible contaminated urine specimens who presents today for cath UA.   Today she reports she has not had any episodes of gross hematuria and denies dysuria.  In-office catheterized UA today positive for trace intact blood; urine microscopy pan negative.  PMH: Past Medical History:  Diagnosis Date   HIV (human immunodeficiency virus infection) (HCC)    Hypertension     Surgical History: Past Surgical History:  Procedure Laterality Date   BREAST EXCISIONAL BIOPSY Left    neg   lap band surgery      Home Medications:  Allergies as of 10/01/2023   No Known Allergies      Medication List        Accurate as of October 01, 2023  5:28 PM. If you have any questions, ask your nurse or doctor.          Cabenuva  600 & 900 MG/3ML injection Generic drug: cabotegravir  & rilpivirine  ER Inject 1 kit into the muscle every 2 (two) months.   lisinopril 5 MG tablet Commonly known as: ZESTRIL Take 1 tablet by mouth daily.   Ozempic (2 MG/DOSE) 8 MG/3ML Sopn Generic drug: Semaglutide (2 MG/DOSE) INJECT 2 MG EVERY WEEK BY SUBCUTANEOUS ROUTE AS DIRECTED.        Allergies:  No Known Allergies  Family History: No family history on file.  Social History:   reports that she has never smoked. She has never used smokeless tobacco. She reports current alcohol use. She reports that she does not use drugs.  Physical Exam: BP 123/77   Pulse 63   Ht 5' 5 (1.651 m)   Wt 163 lb (73.9 kg)   BMI 27.12 kg/m   Constitutional:  Alert and oriented, no acute distress, nontoxic appearing HEENT: [place], AT Cardiovascular: No clubbing, cyanosis, or edema Respiratory: Normal respiratory effort, no increased work of breathing Skin: No rashes, bruises or suspicious  lesions Neurologic: Grossly intact, no focal deficits, moving all 4 extremities Psychiatric: Normal mood and affect  Laboratory Data: Results for orders placed or performed in visit on 10/01/23  Microscopic Examination   Collection Time: 10/01/23  3:33 PM   Urine  Result Value Ref Range   WBC, UA 0-5 0 - 5 /hpf   RBC, Urine 0-2 0 - 2 /hpf   Epithelial Cells (non renal) 0-10 0 - 10 /hpf   Bacteria, UA None seen None seen/Few  Urinalysis, Complete   Collection Time: 10/01/23  3:33 PM  Result Value Ref Range   Specific Gravity, UA <1.005 (L) 1.005 - 1.030   pH, UA 6.0 5.0 - 7.5   Color, UA Yellow Yellow   Appearance Ur Clear Clear   Leukocytes,UA Negative Negative   Protein,UA Negative Negative/Trace   Glucose, UA Negative Negative   Ketones, UA Negative Negative   RBC, UA Trace (A) Negative   Bilirubin, UA Negative Negative   Urobilinogen, Ur 0.2 0.2 - 1.0 mg/dL   Nitrite, UA Negative Negative   Microscopic Examination See below:    Assessment & Plan:   1. Microscopic hematuria (Primary) Cath UA today with no microscopic hematuria.  At this point, I offered a repeat cath UA in 2 months and she declined, which is reasonable.  We discussed return precautions including persistent microscopic hematuria on outside urine testing  or gross hematuria. - Urinalysis, Complete   Return if symptoms worsen or fail to improve.  Lucie Hones, PA-C  [facility] [place] [address], [address] [place], [place] [address] [phone]

## 2023-10-04 ENCOUNTER — Encounter (INDEPENDENT_AMBULATORY_CARE_PROVIDER_SITE_OTHER): Payer: Self-pay | Admitting: Nurse Practitioner

## 2023-10-04 ENCOUNTER — Ambulatory Visit (INDEPENDENT_AMBULATORY_CARE_PROVIDER_SITE_OTHER): Admitting: Nurse Practitioner

## 2023-10-04 VITALS — BP 122/81 | HR 59 | Wt 163.2 lb

## 2023-10-04 DIAGNOSIS — I8310 Varicose veins of unspecified lower extremity with inflammation: Secondary | ICD-10-CM | POA: Diagnosis not present

## 2023-10-04 DIAGNOSIS — I10 Essential (primary) hypertension: Secondary | ICD-10-CM

## 2023-10-05 ENCOUNTER — Ambulatory Visit: Payer: Self-pay

## 2023-10-05 ENCOUNTER — Other Ambulatory Visit: Payer: Self-pay

## 2023-10-05 DIAGNOSIS — B20 Human immunodeficiency virus [HIV] disease: Secondary | ICD-10-CM

## 2023-10-05 NOTE — Telephone Encounter (Signed)
-----   Message from [facility] sent at 09/28/2023  8:33 AM EDT ----- Regarding: FW: Small blip in VL . 250 . CD4 is 573. Will check VL next time as well. Thx ----- Message ----- From: Rebecka, Lab In [place] Sent: 09/25/2023   4:27 PM EDT To: Donald Berlin, MD

## 2023-10-05 NOTE — Telephone Encounter (Signed)
 Patient informed of lab results and will come in for repeat VL in 2-4 weeks per our pharmacy team suggestion with her being on Cabenuva .  Becky Lee ONEIDA Ligas, CMA

## 2023-10-07 ENCOUNTER — Encounter (INDEPENDENT_AMBULATORY_CARE_PROVIDER_SITE_OTHER): Payer: Self-pay | Admitting: Nurse Practitioner

## 2023-10-07 NOTE — Progress Notes (Signed)
 Subjective:    Patient ID: Becky Lee, female    DOB: 03/21/1946, 78 y.o.   MRN: [id number] Chief Complaint  Patient presents with   Follow-up     4 week post left leg GSV laser. see gs/fb       The patient returns to the office for followup status post laser ablation of the left saphenous vein on 09/06/2023.  The patient note significant improvement in the lower extremity pain but not resolution of the symptoms. The patient notes multiple residual varicosities bilaterally which continued to hurt with dependent positions and remained tender to palpation. The patient's swelling is minimally from preoperative status. The patient continues to wear graduated compression stockings on a daily basis but these are not eliminating the pain and discomfort. The patient continues to use over-the-counter anti-inflammatory medications to treat the pain and related symptoms but this has not given the patient relief. The patient notes the pain in the lower extremities is causing problems with daily exercise, problems at work and even with household activities such as preparing meals and doing dishes.  The patient is otherwise done well and there have been no complications related to the laser procedure or interval changes in the patient's overall   Post laser ultrasound shows successful ablation of the left gsv      Review of Systems  All other systems reviewed and are negative.      Objective:   Physical Exam Vitals reviewed.  HENT:     Head: Normocephalic.   Cardiovascular:     Rate and Rhythm: Normal rate.     Pulses: Normal pulses.  Pulmonary:     Effort: Pulmonary effort is normal.   Skin:    General: Skin is warm and dry.   Neurological:     Mental Status: She is alert and oriented to person, place, and time.   Psychiatric:        Mood and Affect: Mood normal.        Behavior: Behavior normal.        Thought Content: Thought content normal.        Judgment: Judgment normal.      BP 122/81   Pulse (!) 59   Wt 163 lb 3.2 oz (74 kg)   BMI 27.16 kg/m   Past Medical History:  Diagnosis Date   HIV (human immunodeficiency virus infection) (HCC)    Hypertension     Social History   Socioeconomic History   Marital status: Single    Spouse name: Not on file   Number of children: Not on file   Years of education: Not on file   Highest education level: Not on file  Occupational History   Not on file  Tobacco Use   Smoking status: Never   Smokeless tobacco: Never  Substance and Sexual Activity   Alcohol use: Yes   Drug use: No   Sexual activity: Not on file  Other Topics Concern   Not on file  Social History Narrative   Not on file   Social Drivers of Health   Financial Resource Strain: Low Risk  (08/21/2023)   Received from [facility]   Overall Financial Resource Strain (CARDIA)    Difficulty of Paying Living Expenses: Not hard at all  Food Insecurity: No Food Insecurity (08/21/2023)   Received from [facility]   Hunger Vital Sign    Within the past 12 months, you worried that your food would run out before  you got the money to buy more.: Never true    Within the past 12 months, the food you bought just didn't last and you didn't have money to get more.: Never true  Transportation Needs: No Transportation Needs (08/21/2023)   Received from [facility] - Transportation    In the past 12 months, has lack of transportation kept you from medical appointments or from getting medications?: No    Lack of Transportation (Non-Medical): No  Physical Activity: Not on file  Stress: Not on file  Social Connections: Not on file  Intimate Partner Violence: Not on file    Past Surgical History:  Procedure Laterality Date   BREAST EXCISIONAL BIOPSY Left    neg   lap band surgery      History reviewed. No pertinent family history.  No Known Allergies     Latest Ref Rng & Units  09/25/2023    4:05 PM 05/30/2023   10:54 AM 11/28/2022    6:54 PM  CBC  WBC 4.0 - 10.5 K/uL 3.4 - 10.8 x10E3/uL 3.6    3.5  3.6    3.8  3.6   Hemoglobin 12.0 - 15.0 g/dL 88.8 - 84.0 g/dL 87.8    87.6  87.0    86.6  12.6   Hematocrit 36.0 - 46.0 % 34.0 - 46.6 % 36.1    38.0  37.3    39.9  37.4   Platelets 150 - 400 K/uL 150 - 450 x10E3/uL 197    204  106    174  224       CMP     Component Value Date/Time   NA 139 09/25/2023 1605   K 4.0 09/25/2023 1605   CL 104 09/25/2023 1605   CO2 27 09/25/2023 1605   GLUCOSE 82 09/25/2023 1605   BUN 17 09/25/2023 1605   CREATININE 0.71 09/25/2023 1605   CALCIUM 9.4 09/25/2023 1605   PROT 7.7 09/25/2023 1605   ALBUMIN 3.9 09/25/2023 1605   AST 40 09/25/2023 1605   ALT 33 09/25/2023 1605   ALKPHOS 88 09/25/2023 1605   BILITOT 2.1 (H) 09/25/2023 1605   GFRNONAA >60 09/25/2023 1605     No results found.     Assessment & Plan:   1. Varicose veins with inflammation (Primary) Recommend:  The patient has done well post endovenous laser ablation.  At this time she does not have any further complaints of any pain or discomfort.    At this time the patient wishes to continue conservative therapy and is not interested in more invasive treatments such as sclerotherapy.  The Patient will follow up PRN if the symptoms worsen.  2. Primary hypertension Continue antihypertensive medications as already ordered, these medications have been reviewed and there are no changes at this time.   Current Outpatient Medications on File Prior to Visit  Medication Sig Dispense Refill   cabotegravir  & rilpivirine  ER (CABENUVA ) 600 & 900 MG/3ML injection Inject 1 kit into the muscle every 2 (two) months. 6 mL 5   lisinopril (ZESTRIL) 5 MG tablet Take 1 tablet by mouth daily.     OZEMPIC, 2 MG/DOSE, 8 MG/3ML SOPN INJECT 2 MG EVERY WEEK BY SUBCUTANEOUS ROUTE AS DIRECTED.     No current facility-administered medications on file prior to visit.     There are no Patient Instructions on file for this visit. No follow-ups on file.   Wah Sabic E Nesiah Jump, NP

## 2023-10-18 ENCOUNTER — Other Ambulatory Visit
Admission: RE | Admit: 2023-10-18 | Discharge: 2023-10-18 | Disposition: A | Discharge status: Home / Self Care | Source: Ambulatory Visit | Attending: Infectious Diseases | Admitting: Infectious Diseases

## 2023-10-18 DIAGNOSIS — B20 Human immunodeficiency virus [HIV] disease: Secondary | ICD-10-CM | POA: Diagnosis present

## 2023-10-19 LAB — HIV-1 RNA QUANT-NO REFLEX-BLD
HIV 1 RNA Quant: <20 {copies}/mL
LOG10 HIV-1 RNA: UNDETERMINED {Log_copies}/mL

## 2023-11-12 ENCOUNTER — Other Ambulatory Visit: Payer: Self-pay

## 2023-11-12 ENCOUNTER — Other Ambulatory Visit (HOSPITAL_COMMUNITY): Payer: Self-pay

## 2023-11-12 NOTE — Progress Notes (Signed)
 Specialty Pharmacy Refill Coordination Note  Becky Lee is a 78 y.o. female assessed today regarding refills of clinic administered specialty medication(s) Cabotegravir  & Rilpivirine  (CABENUVA )   Clinic requested Courier to Provider Office   Delivery date: 11/20/23   Verified address: [address] [place] [address]   Medication will be filled on 11/19/23.

## 2023-11-19 ENCOUNTER — Other Ambulatory Visit: Payer: Self-pay

## 2023-11-21 NOTE — Progress Notes (Signed)
 Established Patient Visit   Chief Complaint: Chief Complaint  Patient presents with  . Follow-up    1 year    Date of Service: 11/21/2023 Date of Birth: 1945/05/17 PCP: Sadie Tamra Cal, MD [address] [facility] [place] [address]  History of Present Illness:   Becky Lee is a 78 y.o.female patient that presents for 1 year f/u.    Presents for f/u r/t: CAD by CT scan HTN HLD Valvular regurgitation Mild on 08/04/2020 ECHO without stenosis Bradycardia  S/P bariatric surgery 2020 HIV on Cabenuva  ART  Family History: Mother - CVA age 57  Since last visit, Becky Lee reports concerns today: none. Denies chest pain, dyspnea, edema, dizziness, palpitations. She works part-time a Equities trader. She walks dog for exercise daily. Denies exertional s/s or functional decline. Intentional wt loss 65 lbs with Ozempic since initiation 1 year ago. Last 2D ECHO 08/04/2020 showed mild LVH with EF 50-55%, mild MR, TR, and PR, with trivial AR. No valvular stenosis. She had a Lexiscan with Myoview 03/27/2018 that showed no evidence of ischemia. EKG today reveals sinus bradycardia, HR 57 bpm, with prolonged AV conduction (PR 220 ms). V1 and V2 with loss of initial forces consistent with septal infarct. T wave inversions appreciated additionally. When compared to prior EKGs, sinus bradycardia and T wave inversions in V1 and V2 are consistent. Loss of initial forces in V2 is new.   Past Medical and Surgical History  Past Medical History Past Medical History:  Diagnosis Date  . Autoimmune hepatitis (CMS/HHS-HCC) 2010  . GERD (gastroesophageal reflux disease)   . Gravida 0   . History of blood transfusion 1984  . HIV infection (CMS/HHS-HCC) 1984  . HIV positive (CMS/HHS-HCC) DX 1984   She had transfusion related HIV transmission following 3 units for TAH surgery.  . Hypercholesterolemia   . Hypertension   . Morbid obesity (CMS/HHS-HCC)   . Osteoarthritis      Past Surgical History She has a past surgical history that includes Abdominal hysterectomy w/ partial vaginactomy; right foot surgery; Tonsillectomy; lap band surgery; liver biopsy; right thumb surgery; colonoscopic polypectomy  (03/2014); Cholecystectomy (05-30-18); Hysterectomy (1984); gastric bypass (05/30/2018); Colonoscopy (04/27/2014); and Colonoscopy (05/22/2019).   Medications and Allergies  Current Medications  Current Outpatient Medications on File Prior to Visit  Medication Sig Dispense Refill  . CABENUVA  600 mg/3 mL- 900 mg/3 mL IM injection     . cabotegravir -rilpivirine  (CABENUVA ) 600 mg/3 mL- 900 mg/3 mL IM injection Inject 1 kit into the muscle    . lisinopriL (ZESTRIL) 5 MG tablet TAKE 1 TABLET BY MOUTH EVERY DAY 90 tablet 1  . multivitamin tablet Take 1 tablet by mouth once daily    . multivitamin-min-iron-FA-vit K (BARIATRIC MULTIVITAMINS) 45 mg iron- 800 mcg-120 mcg Cap     . OZEMPIC 2 mg/dose (8 mg/3 mL) pen injector Inject 2 mg subcutaneously once a week     No current facility-administered medications on file prior to visit.    Allergies: Patient has no known allergies.  Social and Family History  Social History  reports that she has never smoked. She has never used smokeless tobacco. She reports that she does not currently use alcohol. She reports that she does not use drugs.  Family History Family History  Problem Relation Name Age of Onset  . Cancer Mother Jurnei L   . High blood pressure (Hypertension) Mother Alandra L   . Stroke Mother INOCENTE CROME   . Cancer Father Elsie  W   . Diabetes insipidus Father Elsie ORN   . High blood pressure (Hypertension) Father Elsie ORN   . Alcohol abuse Father Elsie ORN   . Glaucoma Father Elsie ORN   . Hyperlipidemia (Elevated cholesterol) Father Elsie ORN   . Alcohol abuse Brother Elsie Raddle   . No Known Problems Brother      Review of Systems   Review of Systems  Respiratory:  Negative for shortness of breath.    Cardiovascular:  Negative for chest pain, palpitations and leg swelling.  Gastrointestinal:  Negative for heartburn.  Neurological:  Negative for dizziness.      Physical Examination   Vitals:BP 124/72   Pulse 58   Ht 165.1 cm (5' 5)   Wt 73.5 kg (162 lb)   LMP  (LMP Unknown)   SpO2 99%   BMI 26.96 kg/m  Ht:165.1 cm (5' 5) Wt:73.5 kg (162 lb) ADJ:Anib surface area is 1.84 meters squared. Body mass index is 26.96 kg/m.  Physical Exam Vitals reviewed.  Constitutional:      General: She is not in acute distress.    Appearance: Normal appearance.  Cardiovascular:     Rate and Rhythm: Normal rate and regular rhythm.     Heart sounds: Normal heart sounds. No murmur heard. Pulmonary:     Effort: Pulmonary effort is normal. No respiratory distress.     Breath sounds: Normal breath sounds.  Musculoskeletal:     Right lower leg: No edema.     Left lower leg: No edema.  Neurological:     Mental Status: She is alert.     Data & Results   Recent Labs    08/16/22 0926 02/14/23 0812 08/15/23 1338  CHOLTOTAL 133 140 140  HDL 55.7 61.7 62.5  LDLCALC 68 66 65  VLDL 10 13 12   TRIG 49 63 61    Recent Labs    08/16/22 0926 02/14/23 0812 08/15/23 1338  NA 140 142 138  K 3.8 3.7 4.5  BUN 17 13 9   CREATININE 0.8 0.8 0.9  CO2 29.3 30.8 28.2  GLUCOSE 87 87 81  ALT 38 18 34  AST 43* 25 45*  TBILI 1.1 1.6* 1.7*  ALB 3.9 3.9 3.9    Recent Labs    08/16/22 0926 02/14/23 0812 08/15/23 1338  WBC 3.1* 3.2* 3.7*  HGB 12.5 12.8 12.8  HCT 36.4 36.6 37.1  MCV 83.9 85.1 84.1  PLT 198 171 155    Recent Labs    08/16/22 0926 02/14/23 0812 08/15/23 1338  TSH 2.156 2.799 1.081  HGBA1C 5.3 4.9 4.9     ECHO 03/26/2018 INTERPRETATION NORMAL LEFT VENTRICULAR SYSTOLIC FUNCTION   WITH MILD LVH NORMAL RIGHT VENTRICULAR SYSTOLIC FUNCTION MODERATE VALVULAR REGURGITATION (See above) NO VALVULAR STENOSIS MODERATE MR MILD TR, PR EF >55%   Assessment   78 y.o.  female with  Encounter Diagnoses  Name Primary?  . Coronary artery disease involving native coronary artery of native heart without angina pectoris Yes  . Benign essential HTN   . S/P bariatric surgery   . Abnormal EKG     Plan   Orders Placed This Encounter  Procedures  . ECG 12-lead  . Echo stress test   1. Coronary artery disease involving native coronary artery of native heart without angina pectoris -     ECG 12-lead -     Echo stress test; Future  2. Benign essential HTN -     ECG 12-lead -  Echo stress test; Future  3. S/P bariatric surgery  4. Abnormal EKG -     Echo stress test; Future  - Stress echo to evaluate septal wall function, ischemia with EKG changes and for valvular insufficiency surveillance. Pt asymptomatic and euvolemic. Will send results via MyChart. - CAD medical management - Avoid aspirin s/p gastric bypass. Avoid BB with bradycardia. LDL at goal < 70 mg/dL on recent labs with lifestyle intervention. - BP at goal on lisinopril 5 mg qd. Recent CMP stable. Continue current management.  - Continue exercise, Ozempic, and weight loss.    Return in about 1 year (around 11/20/2024).   Attestation Statement:   I personally performed the service, non-incident to. [facility])   MARY TINNIE LAUNIE MAIDEN, NP

## 2023-11-27 ENCOUNTER — Ambulatory Visit: Attending: Infectious Diseases | Admitting: Infectious Diseases

## 2023-11-27 ENCOUNTER — Encounter: Payer: Self-pay | Admitting: Infectious Diseases

## 2023-11-27 VITALS — BP 131/82 | HR 74 | Temp 97.2°F | Ht 60.0 in | Wt 161.0 lb

## 2023-11-27 DIAGNOSIS — I10 Essential (primary) hypertension: Secondary | ICD-10-CM | POA: Diagnosis not present

## 2023-11-27 DIAGNOSIS — H16222 Keratoconjunctivitis sicca, not specified as Sjogren's, left eye: Secondary | ICD-10-CM

## 2023-11-27 DIAGNOSIS — B20 Human immunodeficiency virus [HIV] disease: Secondary | ICD-10-CM | POA: Diagnosis present

## 2023-11-27 DIAGNOSIS — Z79624 Long term (current) use of inhibitors of nucleotide synthesis: Secondary | ICD-10-CM | POA: Diagnosis not present

## 2023-11-27 DIAGNOSIS — E669 Obesity, unspecified: Secondary | ICD-10-CM | POA: Insufficient documentation

## 2023-11-27 DIAGNOSIS — Z9884 Bariatric surgery status: Secondary | ICD-10-CM | POA: Insufficient documentation

## 2023-11-27 DIAGNOSIS — Z6831 Body mass index (BMI) 31.0-31.9, adult: Secondary | ICD-10-CM | POA: Diagnosis not present

## 2023-11-27 DIAGNOSIS — K754 Autoimmune hepatitis: Secondary | ICD-10-CM | POA: Diagnosis not present

## 2023-11-27 DIAGNOSIS — R7989 Other specified abnormal findings of blood chemistry: Secondary | ICD-10-CM | POA: Diagnosis not present

## 2023-11-27 DIAGNOSIS — D72819 Decreased white blood cell count, unspecified: Secondary | ICD-10-CM | POA: Diagnosis not present

## 2023-11-27 DIAGNOSIS — I2510 Atherosclerotic heart disease of native coronary artery without angina pectoris: Secondary | ICD-10-CM | POA: Insufficient documentation

## 2023-11-27 MED ORDER — CABOTEGRAVIR & RILPIVIRINE ER 600 & 900 MG/3ML IM SUER
1.0000 | Freq: Once | INTRAMUSCULAR | Status: AC
  Administered 2023-11-27: 1 via INTRAMUSCULAR

## 2023-11-27 NOTE — Progress Notes (Signed)
 NAME: Becky Lee  DOB: [birth year]  MRN: [id number]  Date/Time: 11/27/2023 8:49 AM Subjective:   Pt is here for  HIV care Last seen April 2025  Doing well now  had a URI after the cruise in [place] 2025- had HIV VL blip and rechecked and <20 Also had vein ablation left leg- knee pain resolved Since Dec 2024 16 pound weight loss on ozempic On cabenuva  injections   April 2025  saw podiatrist Dr.Cline in Feb 2025 and daignsoed with Tibialis tendinitis  and given  ASO ankle brace Received hyularon injection left knee on 06/25/23 Saw vein and vascular on 07/25/23 for left leg varicose veins and will be getting laser Rx She is on ozempic and has lost 75 pounds She is doing well Is going on a cruise this weekend Vl < 20 and cd4 was 554 ( 39%) on 05/30/23 Platelet was 105  in feb ( has not been low before) she will se eher PCP in May and says she will get it checked  Becky Lee is a 78 y.o. female with a history of HIV, HTN, Morbid obesity, had lap band surgery complicated  by , port infection, removal of band in 2018, autoimmune hepatitis.  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 Thom Ozell Pin at [facility] for bariatric surgery in [place].   She was diagnosed in 1984 and she states it was secondary to a blood transfusion she had for total abdominal hysterectomy.  She remembers that her nadir CD4 was in single digits.  She was in [place]  then.   Her cd4 1118 and Vl < 20 on Sep 07, 2017.. 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 Not sexually active in many years now  AIDS diagnosed 1984 Nadir Cd4 <50 OI - zoster,  HAARt history- many regimens Combivir, crixivan,  atripla odefsey  Descovy  + tivicay - DC 04/28/21 Cabenuva  04/28/21  Acquired thru- blood transfusion for TAH in 1984  ? Past Medical History:  Diagnosis Date   HIV (human immunodeficiency virus infection) (HCC)    Hypertension    morbid obesity Autoimmune hepatitis  In 2008- took steroids and azothiprine Fibroid uterus with mennorhagia 12/19/ 2019- SPECT study FINDINGS: Regional wall motion:  reveals normal myocardial thickening and wall motion.The overall quality of the study is good.   Left ventricular cavity: normal. Perfusion Analysis:  SPECT images demonstrate homogeneous tracer distribution throughout the myocardium.    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  FH: alcoholism- deceased-brother Liver /lung cancer dad CVA-mother  Allergy-NKDA ? Meds lisinopril MVT ( bariatric advantage multivitamin) Cabenuva    REVIEW OF SYSTEMS:  Const: negative fever, negative chills,lost 45 pounds since starting ozempic April 2024  Eyes: negative diplopia or visual changes, negative eye pain ENT: has coryza, negative sore throat Broken teeth- got a  new plate Resp: negative cough, hemoptysis, dyspnea Cards: negative for chest  pain, palpitations, lower extremity edema GU: negative for frequency, dysuria and hematuria Skin: negative for rash and pruritus Heme: negative for easy bruising and gum/nose bleeding MSK   Neurolo:negative for headaches, dizziness, vertigo, memory problems  Psych: negative for feelings of anxiety, depression   Objective:  VITALS:  BP 131/82   Pulse 74   Temp (!) 97.2 F (36.2 C) (Temporal)   Ht 5' (1.524 m)   Wt 161 lb (73 kg)    SpO2 96%   BMI 31.44 kg/m   PHYSICAL EXAM:  General: looks well, lost significant weight HEENT N.  Lungs: Clear to auscultation bilaterally. No Wheezing or Rhonchi. No rales. Heart: Regular rate and rhythm, no murmur, rub or gallop. Abdomen:did not examine today Extremities: left leg varicose vein Skin: No rashes or lesions. Not Jaundiced Lymph: Cervical, supraclavicular normal. Neurologic: Grossly non-focal  Health maintenance Vaccination  pneumovac- 23-  01/03/18 Prevnar-13-11/30/18 HepB unknown HepA unknown TdaP 07/28/20 Flu this season Herpes zoster-09/12/19-  2nd dose - given - she will get the date MRNA vaccine ( pfizer X3) Flu vaccine 2022 ______________________ labs RPR-NR on Jan 2023 HEPC ab-NR on 05/14/2018 Lipid- 07/25/21 - TC 147,TGL 68, HDL 62, LDL 71, HIV VL<20 Cd4  617 ( 36%) Jan 2023 quantiferon Gold-nonreactive from Jan 2023 [id number] Geno sure archive done on 05/14/2018 does not show significant mutation.  HIV antibody TSH 1.25 ( 06/13/19) ------------------------------------------------------------  Preventive  Dental- followed currently at dental works Colonoscopy- 05/22/19 Opthal cervical pap-not sexually active and has not had it in many years Mammogram-02/08/22  LABS T cell count: 531 (11/2022) T cell percentage: 38.6 (2020) T cell percentage: 31.8 (2020) T cell percentage: 37.9 T cell percentage: 46 T cell percentage: 47 WBC: low Viral load: undetectable Total cholesterol: 133 (08/2022) HDL: 55 (08/2022) LDL: 68 (08/2022) Bilirubin: 1.6 (11/2022)  Impression/Recommendation ?78 year old female with history of HIV AIDS diagnosed in the 84s   ? ?HIV   Stable on Cabenuva  with undetectable viral load. CD4 count 573 (38%) June 2025.   -Continue Cabenuva  injections.   -Administer Cabenuva  injection today.    Varicose vins left leg- underwent ablation left side   Eye dryness  Complaint of spontaneous tearing from left eye. Eye doctor has  evaluated and found no blockage.   -Continue Systane drops as prescribed by eye doctor.     Abnormal Lfts on 07/19/21- History of autoimmune hepatitis was on prednisone  and azathioprine when she was in [place]  but not anymore.  There is a possibility that it was drug-induced hepatitis.( AST 54, ALT 45 and bili 1.6--all normal in April 2023) /HEPB/HEPC/HEPA neg   Leucopenia- chronic and stable -  Hypertension well controlled   History of coronary artery disease diagnosed when she had a work-up for the first lap band surgery SPECT scan done 03/28/18 was normal  Saw Dr.Callwood on 11/21/23 Getting stress echo tomorrow  Statin -contraindicated because of autoimmune hepatitis and abnormal LFTS  Lipids -very good ( TC 147/ Hdl 62/LDL 71/TGL 147)    Obesity: Had lap band surgery in 2008 and it was removed in 2018 because of the port infection.  On 05/30/2018 she underwent Roux-en-Y bypass She is now on ozempic Has lost 140 pounds since 2020  Discussed the management with the patient in great detail  Follow up 2 months for cabenuva  injection and labs

## 2023-11-29 ENCOUNTER — Ambulatory Visit
Admission: RE | Admit: 2023-11-29 | Discharge: 2023-11-29 | Disposition: A | Discharge status: Home / Self Care | Source: Ambulatory Visit | Attending: Internal Medicine | Admitting: Internal Medicine

## 2023-11-29 DIAGNOSIS — Z1231 Encounter for screening mammogram for malignant neoplasm of breast: Secondary | ICD-10-CM | POA: Diagnosis present

## 2024-01-14 ENCOUNTER — Other Ambulatory Visit: Payer: Self-pay

## 2024-01-14 ENCOUNTER — Other Ambulatory Visit (HOSPITAL_COMMUNITY): Payer: Self-pay

## 2024-01-14 NOTE — Progress Notes (Signed)
 Specialty Pharmacy Refill Coordination Note  Becky Lee is a 78 y.o. female assessed today regarding refills of clinic administered specialty medication(s) Cabotegravir  & Rilpivirine  (CABENUVA )   Clinic requested Courier to Provider Office   Delivery date: 01/22/24   Verified address: [address] [place] [address]   Medication will be filled on 01/21/24.

## 2024-01-29 ENCOUNTER — Ambulatory Visit: Admitting: Infectious Diseases

## 2024-01-29 ENCOUNTER — Ambulatory Visit: Attending: Infectious Diseases

## 2024-01-29 DIAGNOSIS — B20 Human immunodeficiency virus [HIV] disease: Secondary | ICD-10-CM | POA: Diagnosis not present

## 2024-01-29 DIAGNOSIS — Z23 Encounter for immunization: Secondary | ICD-10-CM

## 2024-01-29 MED ORDER — CABOTEGRAVIR & RILPIVIRINE ER 600 & 900 MG/3ML IM SUER
1.0000 | Freq: Once | INTRAMUSCULAR | Status: AC
  Administered 2024-01-29: 1 via INTRAMUSCULAR

## 2024-01-29 NOTE — Progress Notes (Signed)
 Patient in office today for Cabenuva  injections.  Patient tolerated injections well.  No questions

## 2024-03-11 ENCOUNTER — Other Ambulatory Visit: Payer: Self-pay

## 2024-03-11 ENCOUNTER — Other Ambulatory Visit (HOSPITAL_COMMUNITY): Payer: Self-pay

## 2024-03-11 NOTE — Progress Notes (Signed)
 Patient's refill has been scheduled in [place].

## 2024-03-11 NOTE — Progress Notes (Signed)
 Specialty Pharmacy Refill Coordination Note  Becky Lee is a 78 y.o. female assessed today regarding refills of clinic administered specialty medication(s) Cabotegravir  & Rilpivirine  (CABENUVA )   Clinic requested Courier to Provider Office   Delivery date: 03/18/24   Verified address: [address] [place] [address]   Medication will be filled on 03/17/24.

## 2024-03-17 ENCOUNTER — Other Ambulatory Visit: Payer: Self-pay

## 2024-03-27 ENCOUNTER — Ambulatory Visit: Attending: Infectious Diseases

## 2024-03-27 DIAGNOSIS — B20 Human immunodeficiency virus [HIV] disease: Secondary | ICD-10-CM

## 2024-03-27 MED ORDER — CABOTEGRAVIR & RILPIVIRINE ER 600 & 900 MG/3ML IM SUER
1.0000 | Freq: Once | INTRAMUSCULAR | Status: AC
  Administered 2024-03-27: 16:00:00 1 via INTRAMUSCULAR

## 2024-03-27 NOTE — Progress Notes (Signed)
 PATIENT IN OFFICE FOR CABENUVA  INJECTION. PATIENT TOLERATED WELL

## 2024-05-12 ENCOUNTER — Other Ambulatory Visit: Payer: Self-pay | Admitting: Pharmacist

## 2024-05-12 ENCOUNTER — Other Ambulatory Visit: Payer: Self-pay

## 2024-05-12 ENCOUNTER — Other Ambulatory Visit (HOSPITAL_COMMUNITY): Payer: Self-pay

## 2024-05-12 DIAGNOSIS — B20 Human immunodeficiency virus [HIV] disease: Secondary | ICD-10-CM

## 2024-05-12 MED ORDER — CABOTEGRAVIR & RILPIVIRINE ER 600 & 900 MG/3ML IM SUER
1.0000 | INTRAMUSCULAR | 5 refills | Status: AC
  Filled 2024-05-12: qty 6, 60d supply, fill #0

## 2024-05-27 ENCOUNTER — Ambulatory Visit: Admitting: Infectious Diseases
